# Patient Record
Sex: Male | Born: 1935 | Race: Black or African American | Hispanic: No | Marital: Single | Smoking: Former smoker
Health system: Southern US, Community
[De-identification: ages and names within clinical notes are randomized; demographics above are authoritative.]

## PROBLEM LIST (undated history)

## (undated) DIAGNOSIS — E119 Type 2 diabetes mellitus without complications: Secondary | ICD-10-CM

## (undated) DIAGNOSIS — M869 Osteomyelitis, unspecified: Secondary | ICD-10-CM

## (undated) DIAGNOSIS — N289 Disorder of kidney and ureter, unspecified: Secondary | ICD-10-CM

## (undated) DIAGNOSIS — G8220 Paraplegia, unspecified: Secondary | ICD-10-CM

## (undated) DIAGNOSIS — I4891 Unspecified atrial fibrillation: Secondary | ICD-10-CM

## (undated) DIAGNOSIS — I739 Peripheral vascular disease, unspecified: Secondary | ICD-10-CM

## (undated) DIAGNOSIS — N189 Chronic kidney disease, unspecified: Secondary | ICD-10-CM

## (undated) DIAGNOSIS — I2510 Atherosclerotic heart disease of native coronary artery without angina pectoris: Secondary | ICD-10-CM

## (undated) DIAGNOSIS — I639 Cerebral infarction, unspecified: Secondary | ICD-10-CM

## (undated) DIAGNOSIS — N319 Neuromuscular dysfunction of bladder, unspecified: Secondary | ICD-10-CM

## (undated) HISTORY — PX: ABOVE KNEE LEG AMPUTATION: SUR20

## (undated) HISTORY — PX: SUPRAPUBIC CATHETER PLACEMENT: SHX2473

## (undated) HISTORY — PX: CORONARY STENT PLACEMENT: SHX1402

## (undated) HISTORY — PX: PACEMAKER PLACEMENT: SHX43

---

## 2004-07-13 ENCOUNTER — Other Ambulatory Visit: Payer: Self-pay

## 2004-07-13 ENCOUNTER — Inpatient Hospital Stay: Payer: Self-pay | Admitting: Internal Medicine

## 2004-07-16 ENCOUNTER — Other Ambulatory Visit: Payer: Self-pay

## 2004-07-21 ENCOUNTER — Other Ambulatory Visit: Payer: Self-pay

## 2004-07-25 ENCOUNTER — Other Ambulatory Visit: Payer: Self-pay

## 2004-08-07 ENCOUNTER — Other Ambulatory Visit: Payer: Self-pay

## 2004-08-07 ENCOUNTER — Inpatient Hospital Stay: Payer: Self-pay | Admitting: Internal Medicine

## 2004-12-17 ENCOUNTER — Ambulatory Visit: Payer: Self-pay | Admitting: Specialist

## 2004-12-24 ENCOUNTER — Ambulatory Visit: Payer: Self-pay | Admitting: Specialist

## 2006-05-01 ENCOUNTER — Other Ambulatory Visit: Payer: Self-pay

## 2006-05-01 ENCOUNTER — Emergency Department: Payer: Self-pay | Admitting: Emergency Medicine

## 2006-05-16 ENCOUNTER — Inpatient Hospital Stay: Payer: Self-pay | Admitting: Internal Medicine

## 2006-05-16 ENCOUNTER — Other Ambulatory Visit: Payer: Self-pay

## 2006-05-17 ENCOUNTER — Other Ambulatory Visit: Payer: Self-pay

## 2006-05-22 ENCOUNTER — Other Ambulatory Visit: Payer: Self-pay

## 2007-09-22 ENCOUNTER — Other Ambulatory Visit: Payer: Self-pay

## 2007-09-22 ENCOUNTER — Inpatient Hospital Stay: Payer: Self-pay | Admitting: Internal Medicine

## 2007-09-23 ENCOUNTER — Other Ambulatory Visit: Payer: Self-pay

## 2007-09-24 ENCOUNTER — Other Ambulatory Visit: Payer: Self-pay

## 2007-09-25 ENCOUNTER — Other Ambulatory Visit: Payer: Self-pay

## 2007-11-12 IMAGING — CT CT ABD-PELV W/ CM
1 of 2 series · 15 of 32 positions shown, 19 images · IV contrast (APPLIED)
Comparison: none

REASON FOR EXAM: Abdominal pain, AAA
COMMENTS:

PROCEDURE:     CT  - CT ABDOMEN / PELVIS  W  - May 16, 2006  [DATE]
RESULT:     Axial images were obtained post intravenous injection of
contrast material.
The report was faxed to the Emergency Room.

[Series 4: aaa · axial · 0.74mm/px · z∈[-85,+329]mm · 15 of 156 slices shown, 19 images]
[im 12/156  soft-tissue]
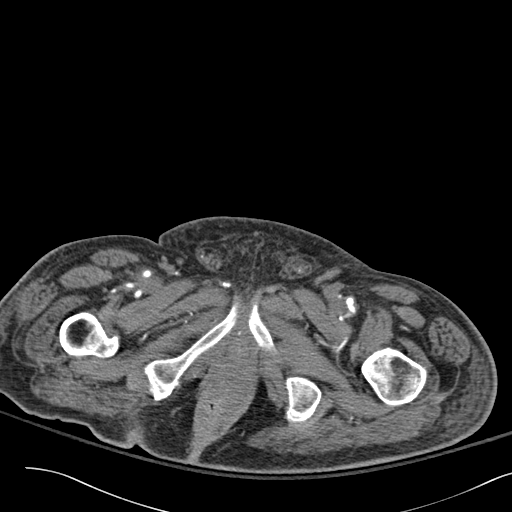
[im 12/156  bone]
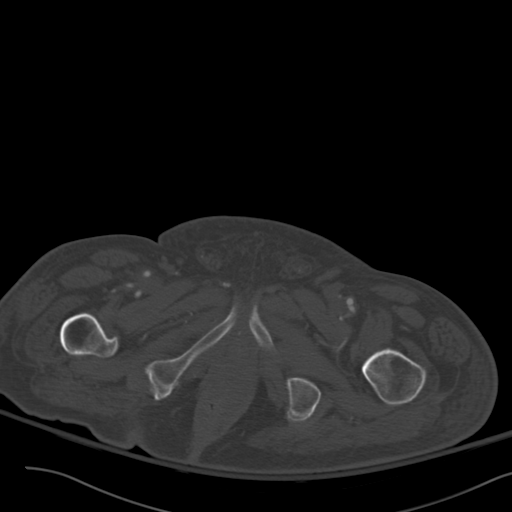
[im 23/156  soft-tissue]
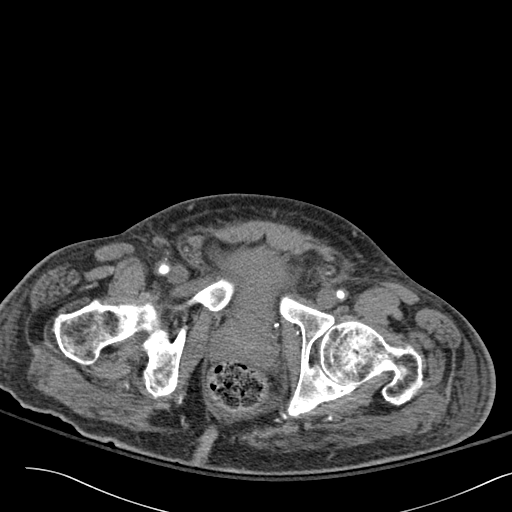
[im 35/156  soft-tissue]
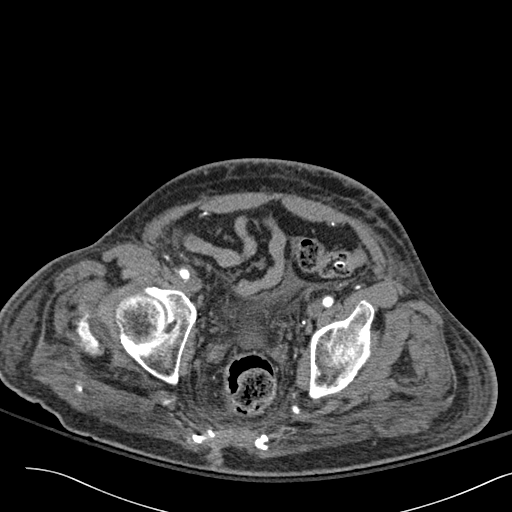
[im 46/156  soft-tissue]
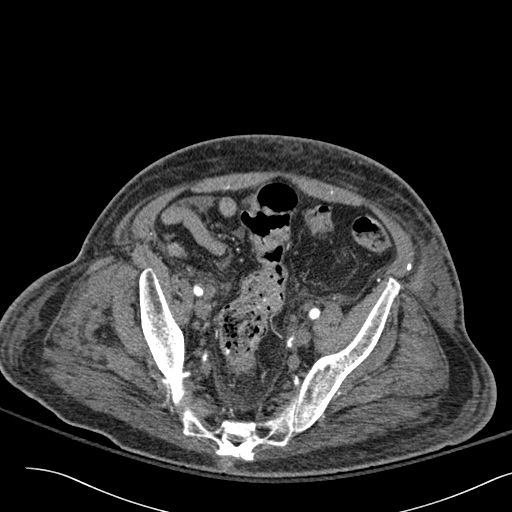
[im 58/156  soft-tissue]
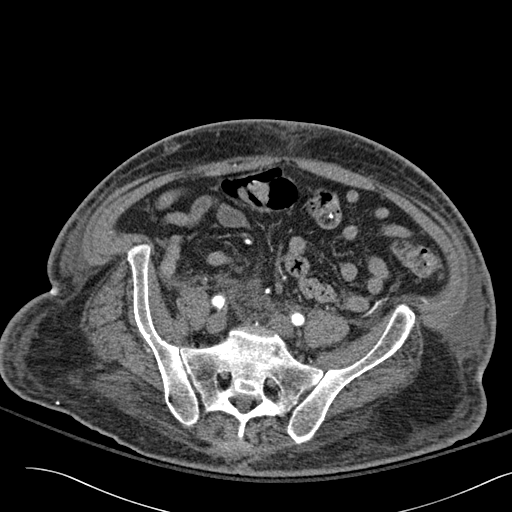
[im 69/156  soft-tissue]
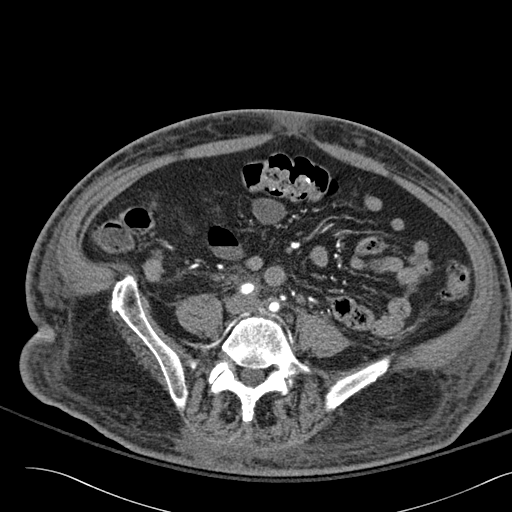
[im 81/156  soft-tissue]
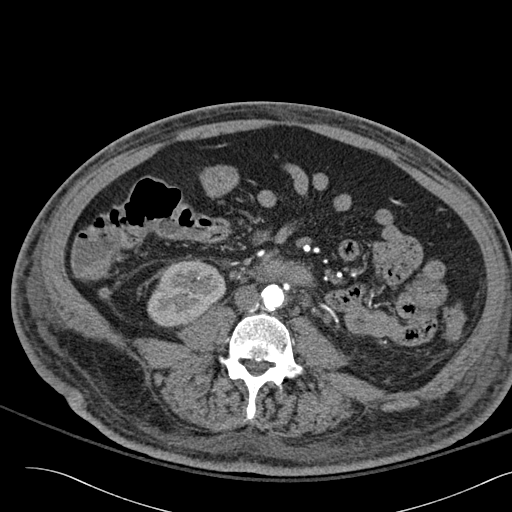
[im 92/156  soft-tissue]
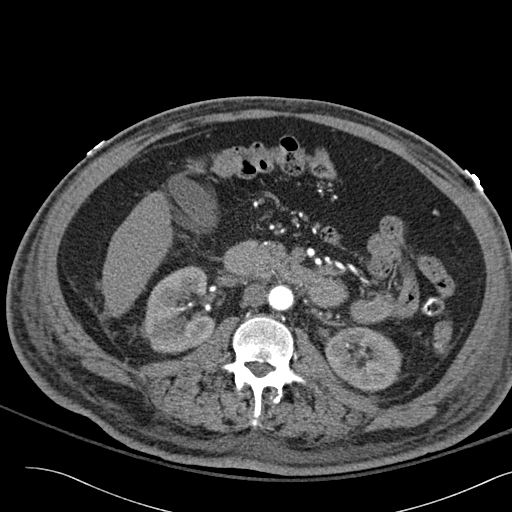
[im 104/156  soft-tissue]
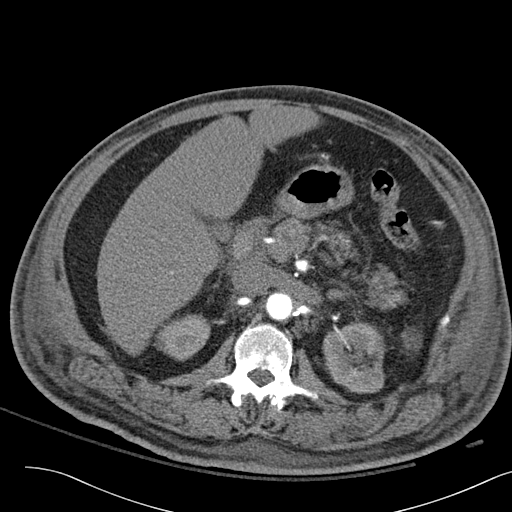
[im 104/156  bone]
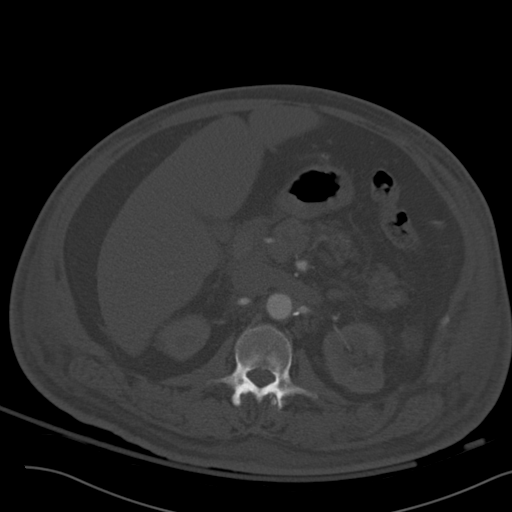
[im 115/156  soft-tissue]
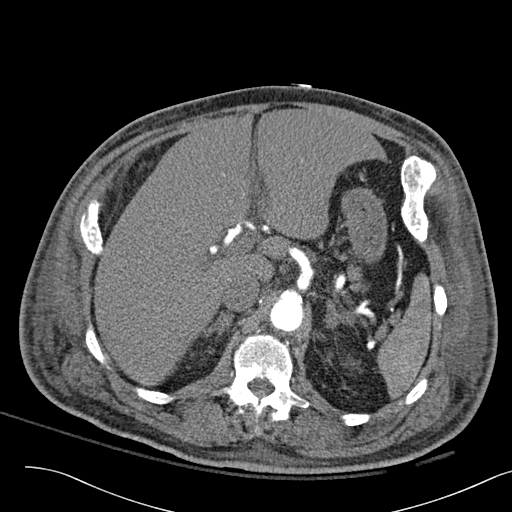
[im 127/156  soft-tissue]
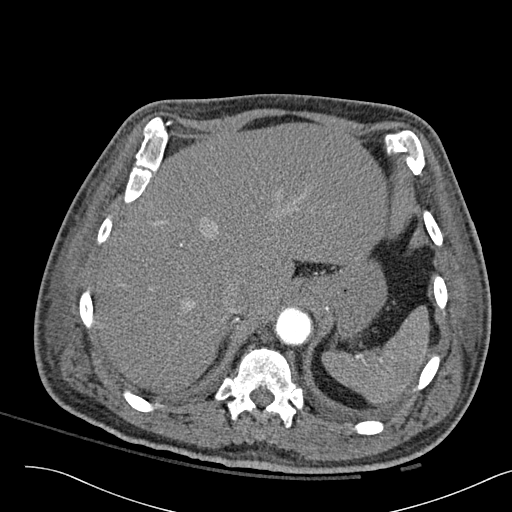
[im 133/156  lung]
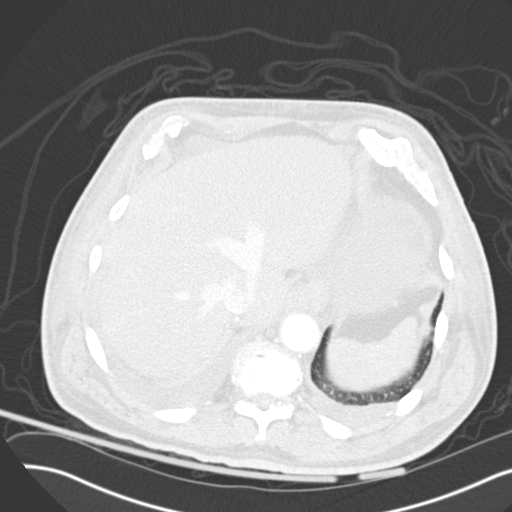
[im 138/156  soft-tissue]
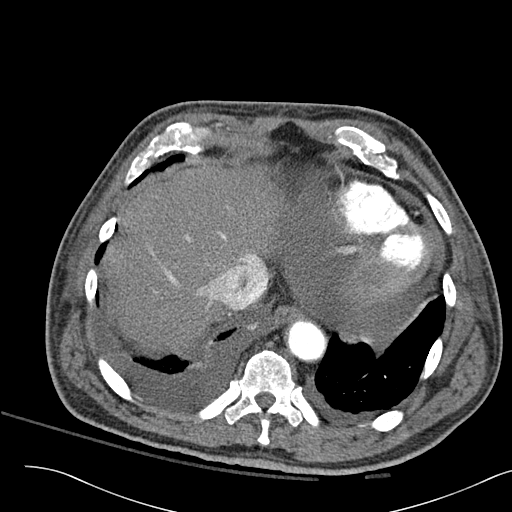
[im 138/156  lung]
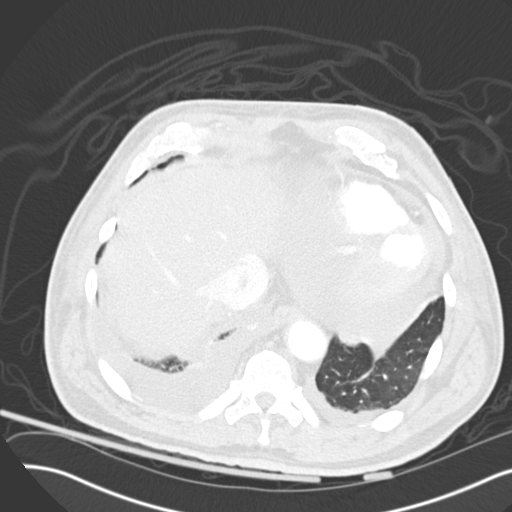
[im 144/156  lung]
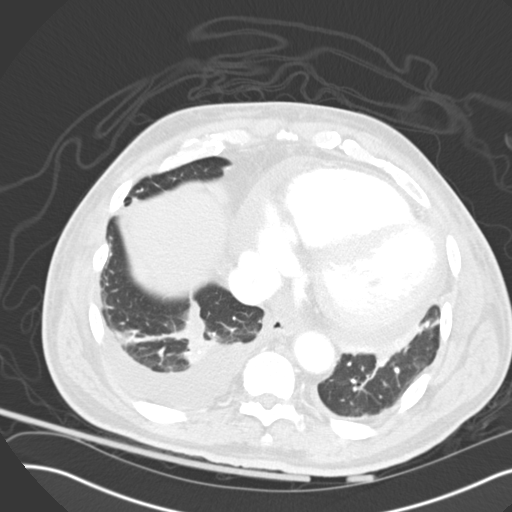
[im 150/156  soft-tissue]
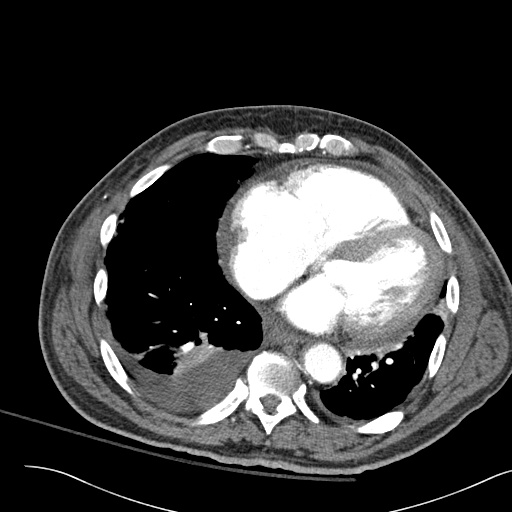
[im 150/156  lung]
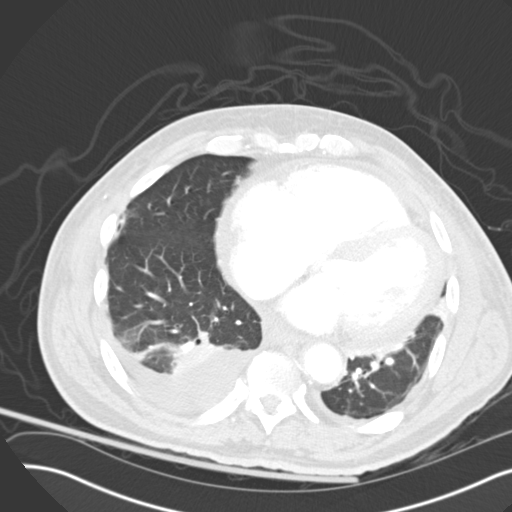

[15 of 32 positions shown; findings below may reference images not displayed]

FINDINGS: A small, RIGHT pleural effusion and small, LEFT pleural effusion
is seen. There is atelectasis in the lung bases. Cardiomegaly is noted with
some pericardial effusion present. No focal masses of the liver are
identified. The spleen appears intact.

No focal pancreatic masses are seen. Minimal fluid is present in the lateral
colonic gutter below the liver. There is some mild thickening of Gerota's
fascia on the RIGHT. There is noted some fluid in the pelvis. There is no
definite appendicitis or diverticulitis. There is noted some thickening of
the urinary bladder wall which could be cystitis and should be correlated
clinically. No hydronephrosis is seen of the kidneys. The adrenals appear
intact. No abdominal aortic aneurysm is noted.

There is noted some atelectasis in the RIGHT lung base.
IMPRESSION: 1.     RIGHT and LEFT effusions with atelectasis.
2.     Small amount of fluid is noted in the abdomen as well as in the
pelvis.
3.     There is thickening of the urinary bladder wall consistent with
cystitis.
4.     No abdominal aortic aneurysm is seen.
5.     Fluid is also seen about the gallbladder.
6.     There appears to be anasarca with some subcutaneous fluid present.
7.     There is also incidentally noted some pericardial effusion.

## 2007-11-14 ENCOUNTER — Observation Stay: Payer: Self-pay | Admitting: Internal Medicine

## 2007-11-14 ENCOUNTER — Other Ambulatory Visit: Payer: Self-pay

## 2007-12-13 ENCOUNTER — Ambulatory Visit: Payer: Self-pay | Admitting: Internal Medicine

## 2008-10-29 ENCOUNTER — Inpatient Hospital Stay: Payer: Self-pay | Admitting: Internal Medicine

## 2008-11-09 ENCOUNTER — Inpatient Hospital Stay: Payer: Self-pay | Admitting: Internal Medicine

## 2009-01-23 ENCOUNTER — Inpatient Hospital Stay: Payer: Self-pay | Admitting: Internal Medicine

## 2009-05-18 ENCOUNTER — Inpatient Hospital Stay: Payer: Self-pay | Admitting: Internal Medicine

## 2009-05-26 ENCOUNTER — Emergency Department: Payer: Self-pay | Admitting: Internal Medicine

## 2009-07-03 ENCOUNTER — Ambulatory Visit: Payer: Self-pay | Admitting: Ophthalmology

## 2009-08-01 ENCOUNTER — Ambulatory Visit: Payer: Self-pay | Admitting: Ophthalmology

## 2009-08-11 ENCOUNTER — Ambulatory Visit: Payer: Self-pay | Admitting: Ophthalmology

## 2009-12-17 ENCOUNTER — Emergency Department: Payer: Self-pay | Admitting: Emergency Medicine

## 2010-05-22 IMAGING — CR DG HAND COMPLETE 3+V*L*
1 series · 3 of 3 positions shown · non-contrast
Comparison: none

REASON FOR EXAM: pain and tenderness
COMMENTS:   Bedside (portable):Y

[Series 1: view not recorded · 0.17mm/px · 3 of 3 slices shown]
[im 1/3]
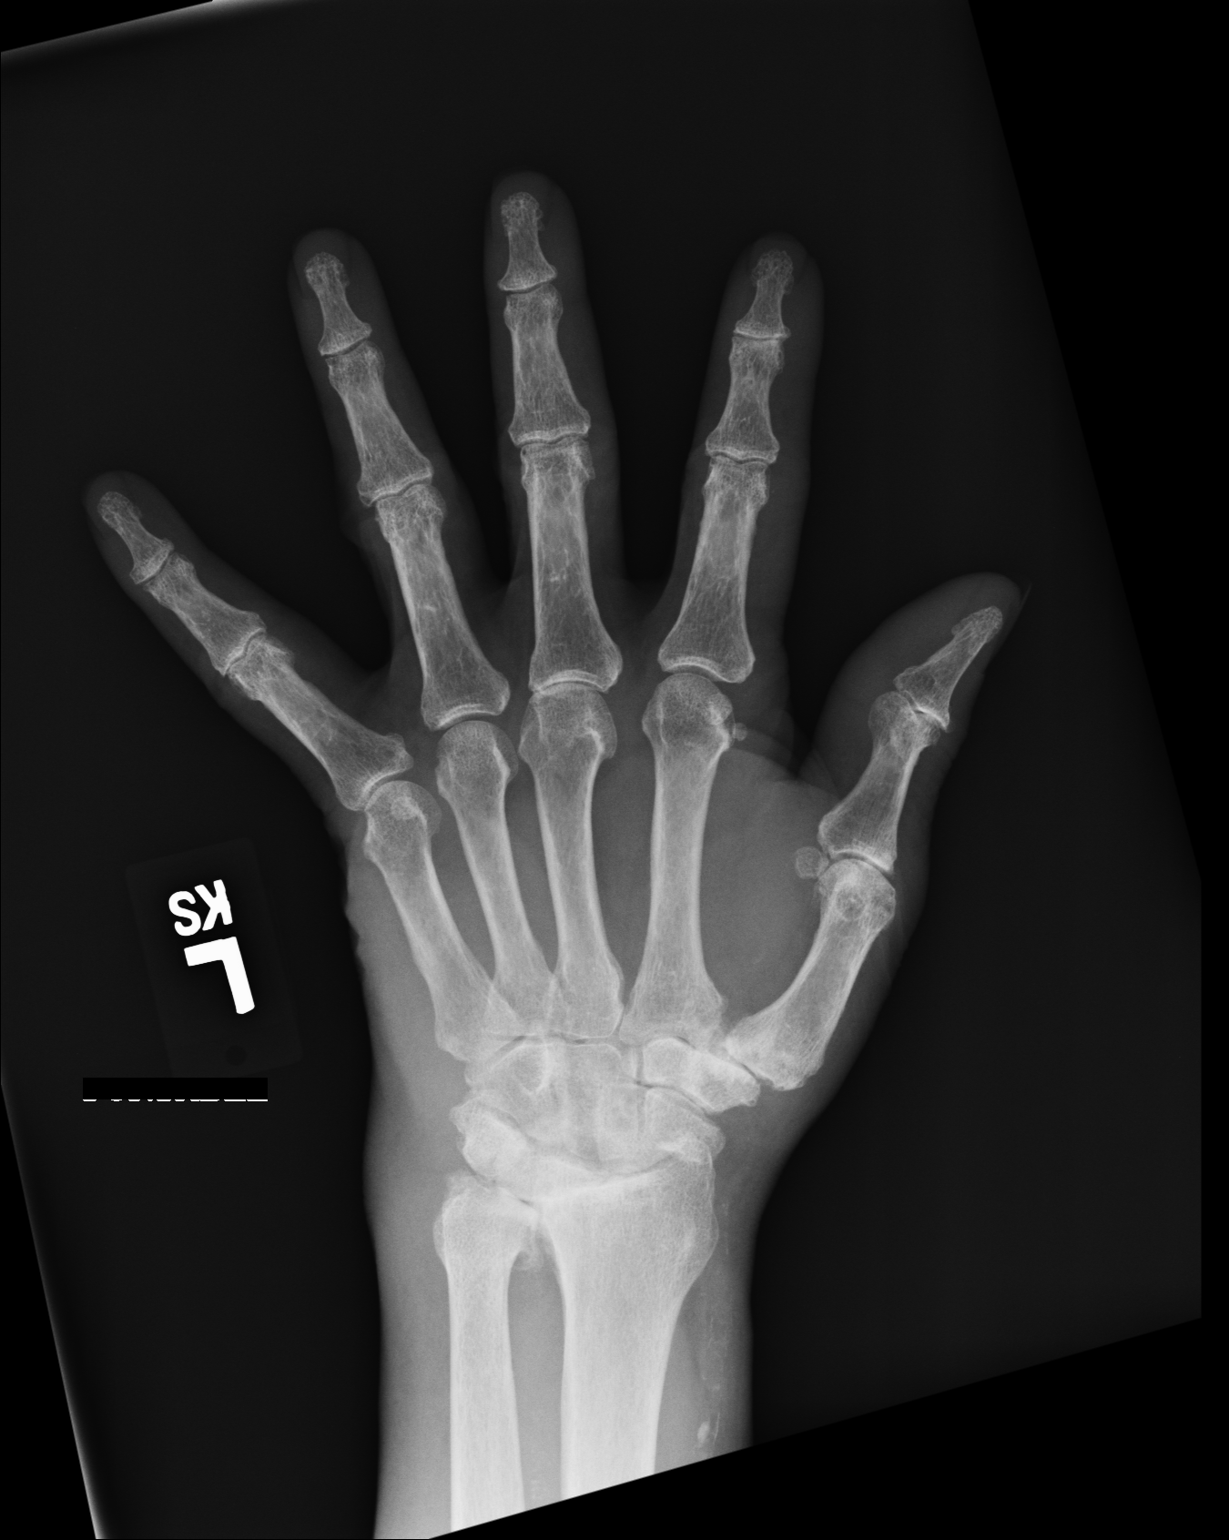
[im 2/3]
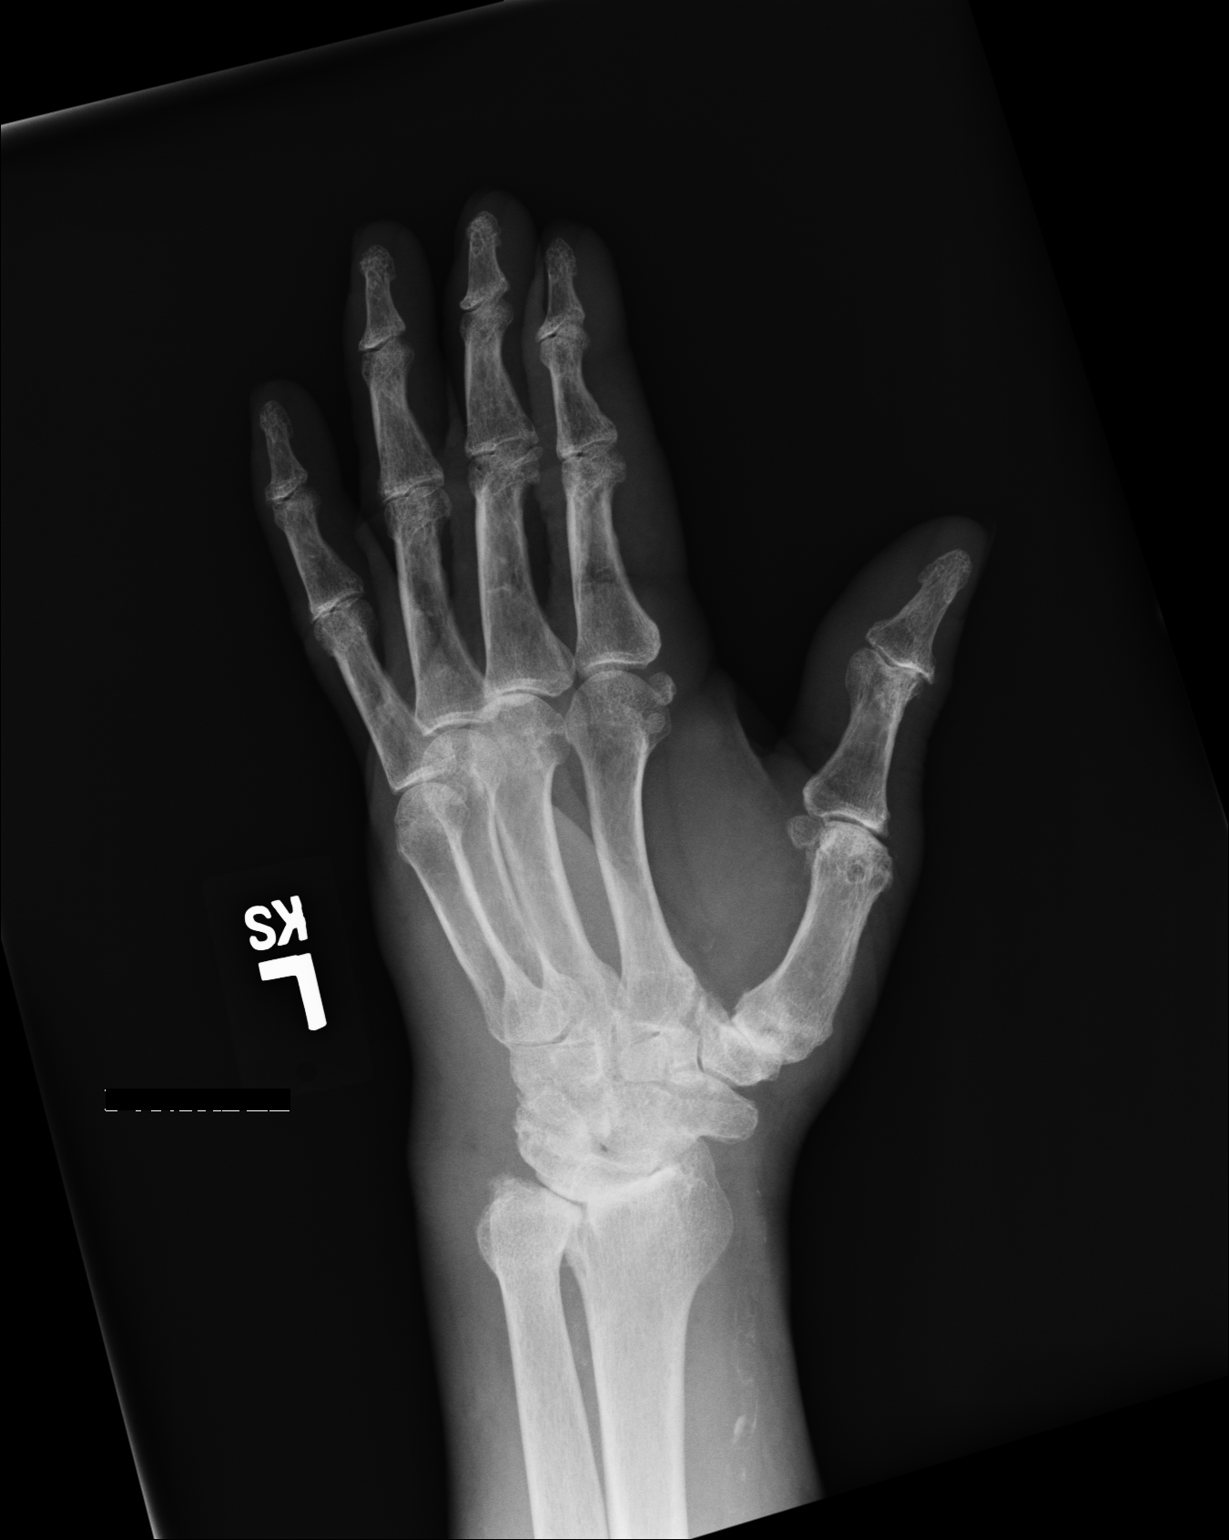
[im 3/3]
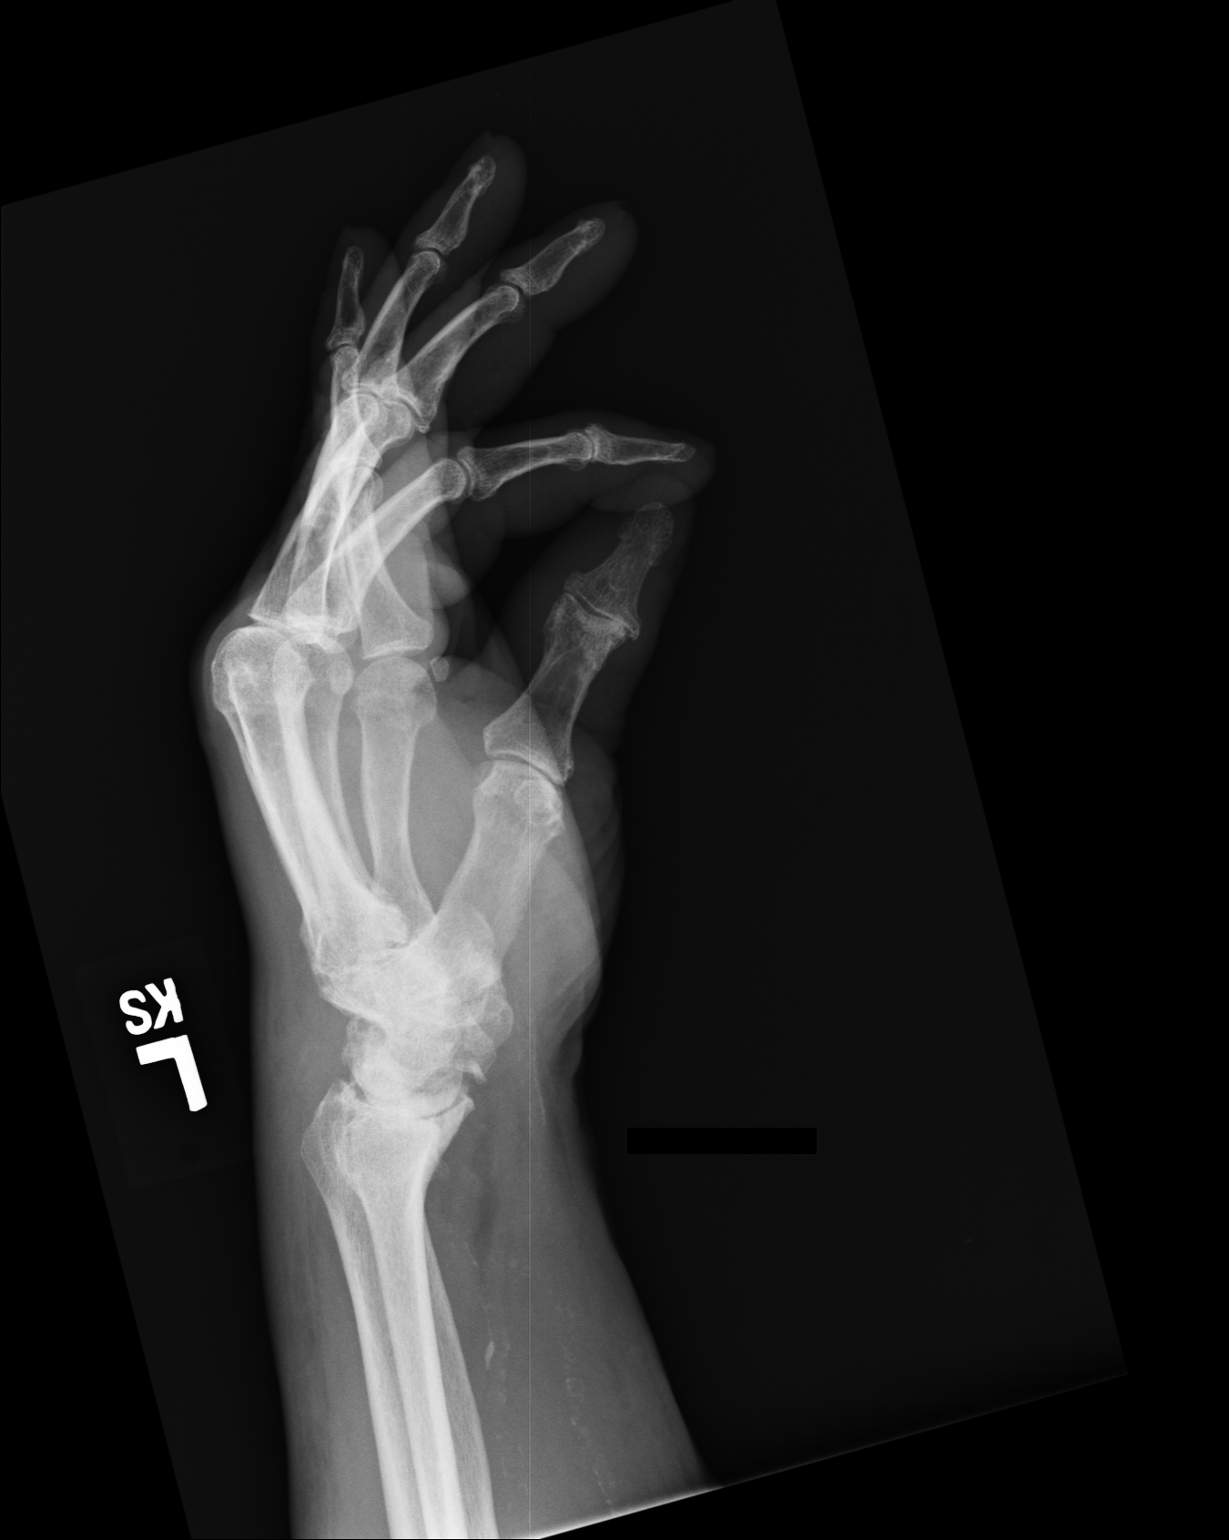

[3 of 3 positions shown; findings below may reference images not displayed]

PROCEDURE:     DXR - DXR HAND LT COMPLETE  W/OBLIQUES  - November 23, 2008  [DATE]

RESULT:     Three views of the left hand reveal the bones to be mildly
osteopenic. Degenerative changes of the wrist are present. There is
narrowing of the third carpometacarpal phalangeal joint and there are mild
degenerative changes involving the interphalangeal joints diffusely. I do
not see evidence of an acute fracture.
IMPRESSION: There are degenerative changes diffusely of the left hand
as well as mild osteopenia.

## 2010-05-22 IMAGING — CR LEFT WRIST - 2 VIEW
1 series · 2 of 2 positions shown · non-contrast
Comparison: none

REASON FOR EXAM: lt hand with pain and tenderness
COMMENTS:   Bedside (portable):Y

[Series 1: view not recorded · 0.17mm/px · 2 of 2 slices shown]
[im 1/2]
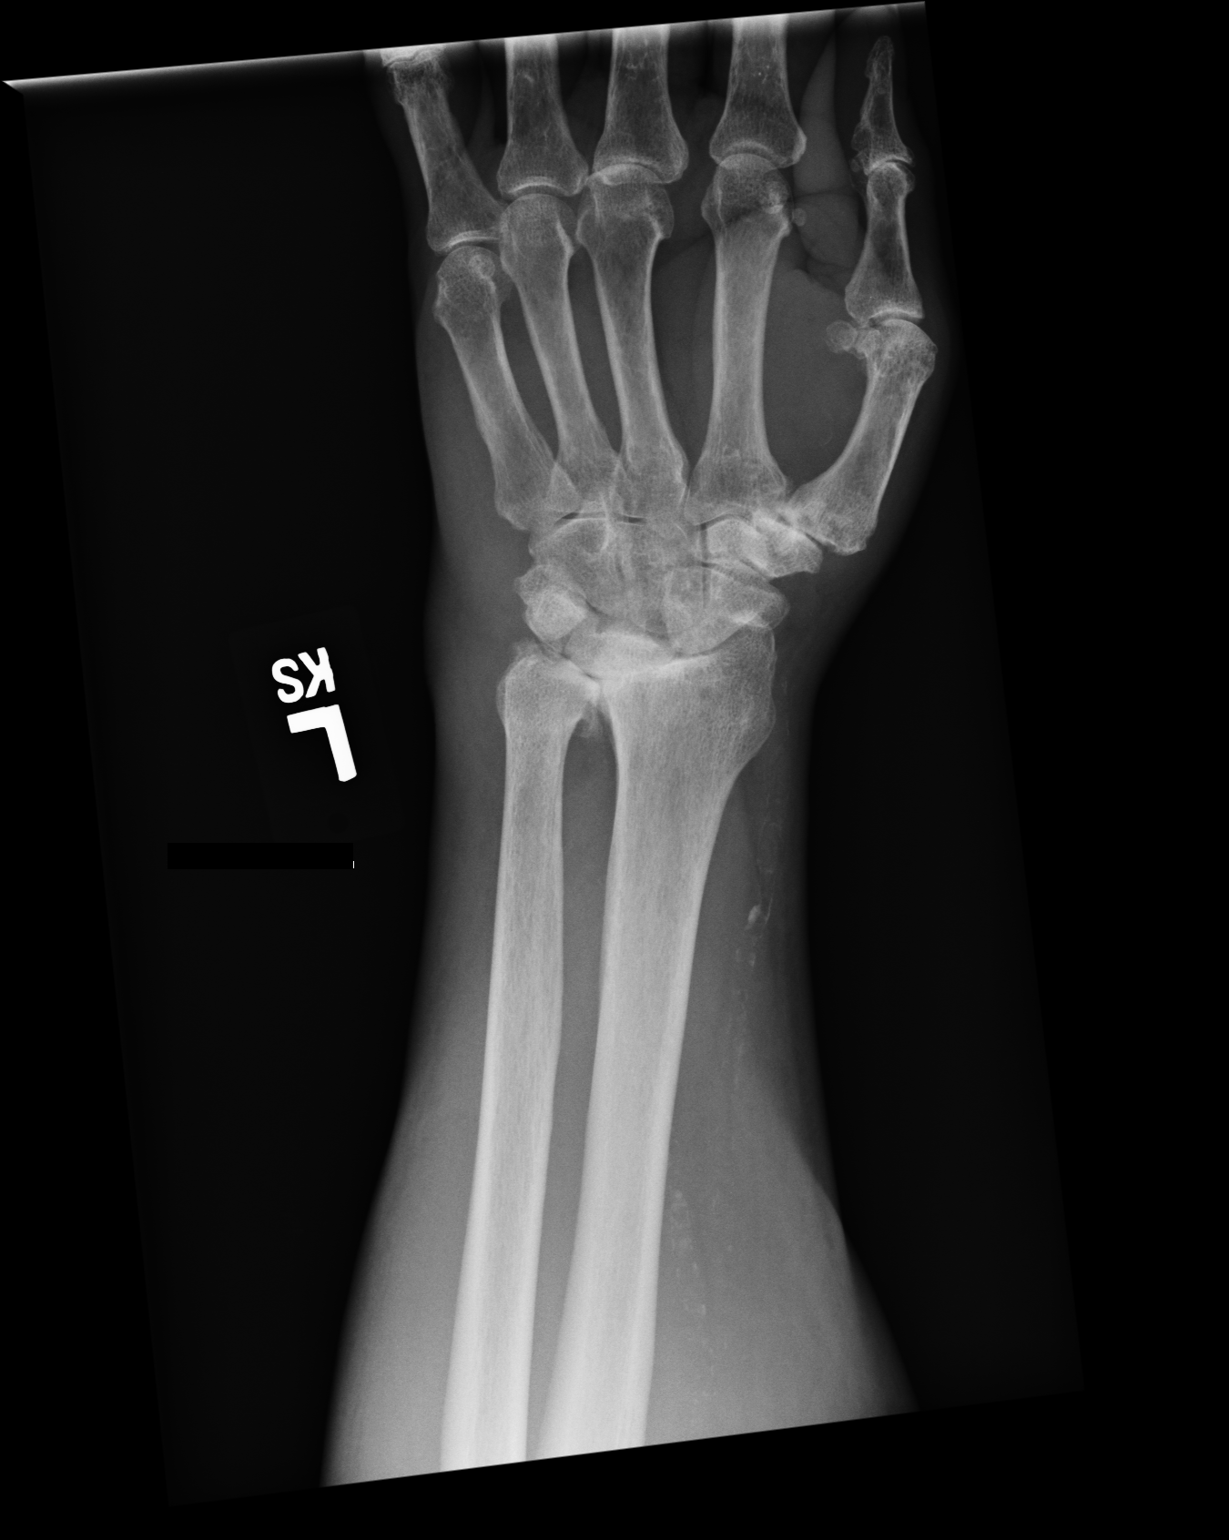
[im 2/2]
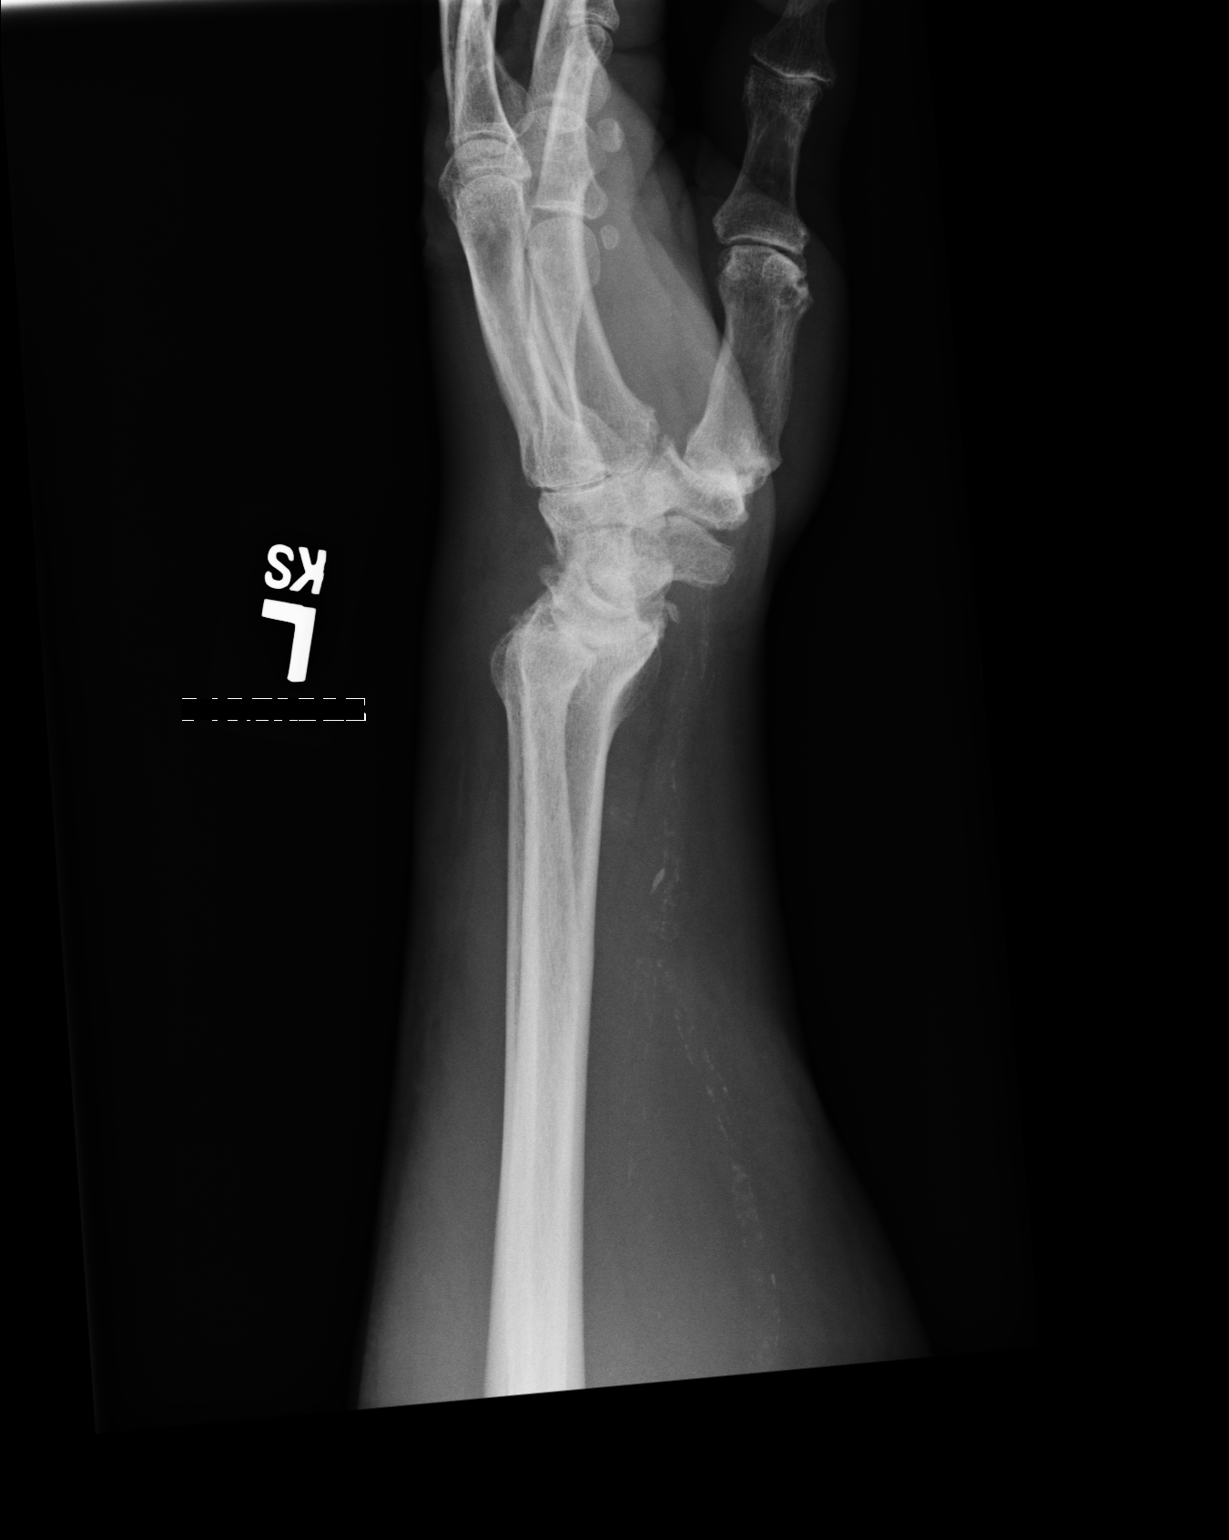

[2 of 2 positions shown; findings below may reference images not displayed]

PROCEDURE:     DXR - DXR WRIST LEFT AP AND LATERAL  - November 23, 2008  [DATE]

RESULT:     AP and lateral views of the left wrist are submitted. There is
degenerative joint space narrowing of the radiocarpal joints. There is some
condensation of the proximal carpal row consistent with chronic degenerative
change. There is a some loss of the ulnar styloid consistent with chronic
degenerative change. There is degenerative change of the first
carpometacarpal joint. Mild degenerative change of the third
metacarpophalangeal joint is present.
IMPRESSION: There are degenerative changes diffusely within the wrist
but especially aspect and the radiocarpal and ulnocarpal joints and the
proximal carpal row. Milder degenerative change of the first carpometacarpal
joint and of the third metacarpophalangeal joint is seen. There is vascular
calcification present. If there are clinical concerns of osteomyelitis, a
three-phase nuclear bone scan would be a useful next step.

## 2010-05-26 ENCOUNTER — Ambulatory Visit: Payer: Self-pay | Admitting: Gastroenterology

## 2010-05-29 LAB — PATHOLOGY REPORT

## 2010-06-16 ENCOUNTER — Ambulatory Visit: Payer: Self-pay | Admitting: Gastroenterology

## 2011-03-09 ENCOUNTER — Ambulatory Visit: Payer: Self-pay

## 2011-10-05 ENCOUNTER — Ambulatory Visit: Payer: Self-pay

## 2011-11-04 ENCOUNTER — Other Ambulatory Visit: Payer: Self-pay | Admitting: Family Medicine

## 2012-08-10 ENCOUNTER — Emergency Department: Payer: Self-pay | Admitting: Emergency Medicine

## 2013-03-12 ENCOUNTER — Inpatient Hospital Stay: Payer: Self-pay | Admitting: Internal Medicine

## 2013-03-12 LAB — PRO B NATRIURETIC PEPTIDE: B-Type Natriuretic Peptide: 3440 pg/mL — ABNORMAL HIGH (ref 0–450)

## 2013-03-12 LAB — COMPREHENSIVE METABOLIC PANEL
ALBUMIN: 2.8 g/dL — AB (ref 3.4–5.0)
AST: 19 U/L (ref 15–37)
Alkaline Phosphatase: 60 U/L
Anion Gap: 5 — ABNORMAL LOW (ref 7–16)
BUN: 17 mg/dL (ref 7–18)
Bilirubin,Total: 0.4 mg/dL (ref 0.2–1.0)
CHLORIDE: 104 mmol/L (ref 98–107)
CO2: 28 mmol/L (ref 21–32)
Calcium, Total: 8.7 mg/dL (ref 8.5–10.1)
Creatinine: 1.3 mg/dL (ref 0.60–1.30)
EGFR (African American): >60
EGFR (Non-African Amer.): 53 — ABNORMAL LOW
GLUCOSE: 97 mg/dL (ref 65–99)
Osmolality: 275 (ref 275–301)
POTASSIUM: 3.5 mmol/L (ref 3.5–5.1)
SGPT (ALT): 9 U/L — ABNORMAL LOW (ref 12–78)
Sodium: 137 mmol/L (ref 136–145)
Total Protein: 7.1 g/dL (ref 6.4–8.2)

## 2013-03-12 LAB — CK TOTAL AND CKMB (NOT AT ARMC)
CK, TOTAL: 265 U/L — AB (ref 35–232)
CK-MB: 3.8 ng/mL — ABNORMAL HIGH (ref 0.5–3.6)

## 2013-03-12 LAB — CBC
HCT: 22.1 % — AB (ref 40.0–52.0)
HGB: 7 g/dL — AB (ref 13.0–18.0)
MCH: 24.4 pg — ABNORMAL LOW (ref 26.0–34.0)
MCHC: 31.8 g/dL — AB (ref 32.0–36.0)
MCV: 77 fL — ABNORMAL LOW (ref 80–100)
Platelet: 317 10*3/uL (ref 150–440)
RBC: 2.89 10*6/uL — AB (ref 4.40–5.90)
RDW: 17.7 % — ABNORMAL HIGH (ref 11.5–14.5)
WBC: 5.7 10*3/uL (ref 3.8–10.6)

## 2013-03-12 LAB — TROPONIN I: TROPONIN-I: 0.19 ng/mL — AB

## 2013-03-12 LAB — PROTIME-INR
INR: 1.2
Prothrombin Time: 15.3 secs — ABNORMAL HIGH (ref 11.5–14.7)

## 2013-03-12 LAB — APTT: ACTIVATED PTT: 37.7 s — AB (ref 23.6–35.9)

## 2013-03-13 LAB — BASIC METABOLIC PANEL
ANION GAP: 5 — AB (ref 7–16)
BUN: 20 mg/dL — AB (ref 7–18)
Calcium, Total: 8.4 mg/dL — ABNORMAL LOW (ref 8.5–10.1)
Chloride: 106 mmol/L (ref 98–107)
Co2: 28 mmol/L (ref 21–32)
Creatinine: 1.3 mg/dL (ref 0.60–1.30)
EGFR (African American): >60
EGFR (Non-African Amer.): 53 — ABNORMAL LOW
Glucose: 106 mg/dL — ABNORMAL HIGH (ref 65–99)
Osmolality: 281 (ref 275–301)
POTASSIUM: 3.3 mmol/L — AB (ref 3.5–5.1)
Sodium: 139 mmol/L (ref 136–145)

## 2013-03-13 LAB — FERRITIN: Ferritin (ARMC): 11 ng/mL (ref 8–388)

## 2013-03-13 LAB — TROPONIN I
TROPONIN-I: 0.17 ng/mL — AB
Troponin-I: 0.17 ng/mL — ABNORMAL HIGH
Troponin-I: 0.18 ng/mL — ABNORMAL HIGH

## 2013-03-13 LAB — URIC ACID: URIC ACID: 10.6 mg/dL — AB (ref 3.5–7.2)

## 2013-03-13 LAB — CBC WITH DIFFERENTIAL/PLATELET
Basophil #: 0 10*3/uL (ref 0.0–0.1)
Basophil %: 0.8 %
Eosinophil #: 0.1 10*3/uL (ref 0.0–0.7)
Eosinophil %: 2.3 %
HCT: 19 % — ABNORMAL LOW (ref 40.0–52.0)
HGB: 6 g/dL — ABNORMAL LOW (ref 13.0–18.0)
LYMPHS PCT: 20.7 %
Lymphocyte #: 1 10*3/uL (ref 1.0–3.6)
MCH: 24.2 pg — AB (ref 26.0–34.0)
MCHC: 31.8 g/dL — ABNORMAL LOW (ref 32.0–36.0)
MCV: 76 fL — ABNORMAL LOW (ref 80–100)
MONOS PCT: 9.3 %
Monocyte #: 0.4 x10 3/mm (ref 0.2–1.0)
NEUTROS ABS: 3.2 10*3/uL (ref 1.4–6.5)
NEUTROS PCT: 66.9 %
Platelet: 275 10*3/uL (ref 150–440)
RBC: 2.49 10*6/uL — ABNORMAL LOW (ref 4.40–5.90)
RDW: 17 % — ABNORMAL HIGH (ref 11.5–14.5)
WBC: 4.8 10*3/uL (ref 3.8–10.6)

## 2013-03-13 LAB — URINALYSIS, COMPLETE
Bilirubin,UR: NEGATIVE
GLUCOSE, UR: NEGATIVE mg/dL (ref 0–75)
KETONE: NEGATIVE
Nitrite: NEGATIVE
PH: 5 (ref 4.5–8.0)
Protein: NEGATIVE
RBC,UR: 3 /HPF (ref 0–5)
Specific Gravity: 1.013 (ref 1.003–1.030)

## 2013-03-13 LAB — IRON AND TIBC
Iron Bind.Cap.(Total): 280 ug/dL (ref 250–450)
Iron Saturation: 5 %
Iron: 15 ug/dL — ABNORMAL LOW (ref 65–175)
UNBOUND IRON-BIND. CAP.: 265 ug/dL

## 2013-03-14 LAB — BASIC METABOLIC PANEL
Anion Gap: 8 (ref 7–16)
BUN: 30 mg/dL — ABNORMAL HIGH (ref 7–18)
CHLORIDE: 101 mmol/L (ref 98–107)
CO2: 24 mmol/L (ref 21–32)
CREATININE: 1.46 mg/dL — AB (ref 0.60–1.30)
Calcium, Total: 8.8 mg/dL (ref 8.5–10.1)
EGFR (African American): 54 — ABNORMAL LOW
EGFR (Non-African Amer.): 46 — ABNORMAL LOW
Glucose: 200 mg/dL — ABNORMAL HIGH (ref 65–99)
OSMOLALITY: 278 (ref 275–301)
POTASSIUM: 3.7 mmol/L (ref 3.5–5.1)
Sodium: 133 mmol/L — ABNORMAL LOW (ref 136–145)

## 2013-03-14 LAB — CBC WITH DIFFERENTIAL/PLATELET
BASOS ABS: 0 10*3/uL (ref 0.0–0.1)
Basophil %: 0.1 %
Eosinophil #: 0 10*3/uL (ref 0.0–0.7)
Eosinophil %: 0 %
HCT: 25.9 % — AB (ref 40.0–52.0)
HGB: 8.3 g/dL — ABNORMAL LOW (ref 13.0–18.0)
LYMPHS PCT: 15.1 %
Lymphocyte #: 0.7 10*3/uL — ABNORMAL LOW (ref 1.0–3.6)
MCH: 24.4 pg — AB (ref 26.0–34.0)
MCHC: 31.9 g/dL — ABNORMAL LOW (ref 32.0–36.0)
MCV: 77 fL — ABNORMAL LOW (ref 80–100)
Monocyte #: 0.1 x10 3/mm — ABNORMAL LOW (ref 0.2–1.0)
Monocyte %: 1.2 %
Neutrophil #: 4.1 10*3/uL (ref 1.4–6.5)
Neutrophil %: 83.6 %
Platelet: 363 10*3/uL (ref 150–440)
RBC: 3.38 10*6/uL — ABNORMAL LOW (ref 4.40–5.90)
RDW: 17.4 % — ABNORMAL HIGH (ref 11.5–14.5)
WBC: 4.9 10*3/uL (ref 3.8–10.6)

## 2013-03-15 LAB — URINE CULTURE

## 2013-06-19 ENCOUNTER — Ambulatory Visit: Payer: Self-pay | Admitting: Gastroenterology

## 2013-06-25 LAB — PATHOLOGY REPORT

## 2014-06-22 NOTE — Discharge Summary (Signed)
PATIENT NAME:  Roger Winters, Roger Winters MR#:  [id number] DATE OF BIRTH:  09-16-1935  DATE OF ADMISSION:  03/12/2013  DATE OF DISCHARGE:  03/14/2013  PRIMARY CARE PHYSICIAN:  Dr. Serafina Royals in the [facility]  FINAL DIAGNOSES: 1.  Acute gout, left wrist.  2.  Chronic iron deficiency anemia.  3.  Hypertension.  4.  Coronary artery disease.  5.  Hyperlipidemia.  6.  Chronic kidney disease stage III.   MEDICATIONS ON DISCHARGE: Include allopurinol 100 mg daily, simvastatin 20 mg at bedtime. Prednisone 5 mg - 4 tablets day one and two, 3 tablets day three, 2 tablets day four, 1 tablet day five and six, then stop. Lisinopril 20 mg twice a day, ferrous sulfate 325 mg twice a day, omeprazole 20 mg daily.   HOME HEALTH: Yes, through [employer].   DIET: Low sodium diet, regular consistency.   ACTIVITY: As tolerated.   FOLLOW UP:  With Dr. Gustavo Lah, Gastroenterology, in a few weeks.   REASON FOR ADMISSION: The patient was admitted 03/12/2013, discharged 03/14/2013. Came in with left hand swelling, suspected gout. The patient was placed on prednisone and Colchicine.   LABORATORY AND RADIOLOGICAL DATA DURING THE HOSPITAL COURSE: Included BNP 3440, glucose 97, BUN 17, creatinine 1.3, sodium 137, potassium 3.5, chloride 104, CO2 of 28, calcium 8.7. Liver function tests normal range, except for albumin low at 2.8. White blood cell count 5.7, H and H 7.0 and 22.1, platelet count of 317. PT, INR, PTT:  15.3, 1.2, 37.7. Troponin borderline at 0.19. Forearm x-ray:  Negative. Chest x-ray:  Chronic cardiomegaly, without failure. EKG: Paced. Ultrasound of the left upper extremity:  No DVT. Urinalysis positive for leukocyte esterase. Uric acid 10.6. Next troponin borderline at 0.17. Ferritin 11, TIBC 280, iron serum 15. Repeat hemoglobin 6. Next troponin 0.17. Urine culture had what looks like 3 organisms in it. White blood cell count upon discharge 4.9, hemoglobin 8.3, platelet count of 367. Creatinine  was 1.46, potassium 3.7.   HOSPITAL COURSE PER PROBLEM LIST:  1.  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 I changed the prednisone to Solu-Medrol, and I gave an extra dose of colchicine. When I saw him on the 13th , by the 14th his gout was much improved. He was able to move his wrist. It was still a little swollen. I did give him a prednisone taper to go home with. His uric acid is a little high, he can start on allopurinol as outpatient.   2.  For his chronic iron deficiency anemia, looking back through old records, he did have a colonoscopy and endoscopy back in 2012 by Dr. Gustavo Lah. I will refer him back to Dr. Gustavo Lah as outpatient. I did start iron supplementation and omeprazole. The patient does have a chronic iron deficiency anemia, requiring 1 unit of blood during the hospitalization.   3.  Hypertension. The patient states that he takes lisinopril at home. I did restart that, since his blood pressure did become elevated when it was held initially. Blood pressure upon discharge was 132/77, but was as high as 188/102.   4.  History of CAD. Not on any aspirin or beta blocker at this  time.   5.  Hyperlipidemia, on simvastatin.   6.  Chronic kidney disease stage III. Continue to monitor as outpatient.   7.  The patient does have an indwelling suprapubic catheter. Urine culture was positive. This is likely colonization because the patient showed no signs of sepsis. Normal white count. No fever. I do recommend changing the suprapubic catheter as per routine.    Time spent on discharge: 35 minutes.    ____________________________ Tana Conch. Leslye Peer, MD rjw:mr D: 03/15/2013 15:27:34 ET T: 03/15/2013 19:25:16  ET JOB#: [id number]  cc: Tana Conch. Leslye Peer, MD, <Dictator> Jomarie Longs, DO Lollie Sails, MD   Marisue Brooklyn MD ELECTRONICALLY SIGNED 03/18/2013 14:32
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
## 2014-12-26 ENCOUNTER — Inpatient Hospital Stay: Payer: Medicare (Managed Care)

## 2014-12-26 ENCOUNTER — Inpatient Hospital Stay
Admit: 2014-12-26 | Discharge: 2014-12-26 | Disposition: A | Discharge status: Home / Self Care | Payer: Medicare (Managed Care) | Attending: Internal Medicine | Admitting: Internal Medicine

## 2014-12-26 ENCOUNTER — Inpatient Hospital Stay
Admission: EM | Admit: 2014-12-26 | Discharge: 2014-12-30 | DRG: 377 | Disposition: A | Discharge status: Home / Self Care | Payer: Medicare (Managed Care) | Attending: Internal Medicine | Admitting: Internal Medicine

## 2014-12-26 ENCOUNTER — Encounter: Payer: Self-pay | Admitting: Emergency Medicine

## 2014-12-26 DIAGNOSIS — K922 Gastrointestinal hemorrhage, unspecified: Secondary | ICD-10-CM | POA: Diagnosis not present

## 2014-12-26 DIAGNOSIS — N170 Acute kidney failure with tubular necrosis: Secondary | ICD-10-CM | POA: Diagnosis present

## 2014-12-26 DIAGNOSIS — E875 Hyperkalemia: Secondary | ICD-10-CM | POA: Diagnosis present

## 2014-12-26 DIAGNOSIS — Z791 Long term (current) use of non-steroidal anti-inflammatories (NSAID): Secondary | ICD-10-CM | POA: Diagnosis not present

## 2014-12-26 DIAGNOSIS — I130 Hypertensive heart and chronic kidney disease with heart failure and stage 1 through stage 4 chronic kidney disease, or unspecified chronic kidney disease: Secondary | ICD-10-CM | POA: Diagnosis present

## 2014-12-26 DIAGNOSIS — K2970 Gastritis, unspecified, without bleeding: Secondary | ICD-10-CM | POA: Diagnosis not present

## 2014-12-26 DIAGNOSIS — N179 Acute kidney failure, unspecified: Secondary | ICD-10-CM

## 2014-12-26 DIAGNOSIS — I252 Old myocardial infarction: Secondary | ICD-10-CM | POA: Diagnosis not present

## 2014-12-26 DIAGNOSIS — Z7982 Long term (current) use of aspirin: Secondary | ICD-10-CM | POA: Diagnosis not present

## 2014-12-26 DIAGNOSIS — M62838 Other muscle spasm: Secondary | ICD-10-CM | POA: Diagnosis present

## 2014-12-26 DIAGNOSIS — Z66 Do not resuscitate: Secondary | ICD-10-CM | POA: Diagnosis present

## 2014-12-26 DIAGNOSIS — Z79899 Other long term (current) drug therapy: Secondary | ICD-10-CM

## 2014-12-26 DIAGNOSIS — N183 Chronic kidney disease, stage 3 (moderate): Secondary | ICD-10-CM | POA: Diagnosis present

## 2014-12-26 DIAGNOSIS — Z89611 Acquired absence of right leg above knee: Secondary | ICD-10-CM | POA: Diagnosis not present

## 2014-12-26 DIAGNOSIS — Z86010 Personal history of colonic polyps: Secondary | ICD-10-CM | POA: Diagnosis not present

## 2014-12-26 DIAGNOSIS — Z833 Family history of diabetes mellitus: Secondary | ICD-10-CM | POA: Diagnosis not present

## 2014-12-26 DIAGNOSIS — I8500 Esophageal varices without bleeding: Secondary | ICD-10-CM | POA: Diagnosis present

## 2014-12-26 DIAGNOSIS — Z89612 Acquired absence of left leg above knee: Secondary | ICD-10-CM | POA: Diagnosis not present

## 2014-12-26 DIAGNOSIS — Z950 Presence of cardiac pacemaker: Secondary | ICD-10-CM

## 2014-12-26 DIAGNOSIS — D631 Anemia in chronic kidney disease: Secondary | ICD-10-CM | POA: Diagnosis present

## 2014-12-26 DIAGNOSIS — Z955 Presence of coronary angioplasty implant and graft: Secondary | ICD-10-CM | POA: Diagnosis not present

## 2014-12-26 DIAGNOSIS — G8220 Paraplegia, unspecified: Secondary | ICD-10-CM | POA: Diagnosis present

## 2014-12-26 DIAGNOSIS — I5020 Unspecified systolic (congestive) heart failure: Secondary | ICD-10-CM | POA: Diagnosis present

## 2014-12-26 DIAGNOSIS — D649 Anemia, unspecified: Secondary | ICD-10-CM

## 2014-12-26 DIAGNOSIS — N319 Neuromuscular dysfunction of bladder, unspecified: Secondary | ICD-10-CM | POA: Diagnosis present

## 2014-12-26 DIAGNOSIS — E1122 Type 2 diabetes mellitus with diabetic chronic kidney disease: Secondary | ICD-10-CM | POA: Diagnosis present

## 2014-12-26 DIAGNOSIS — D62 Acute posthemorrhagic anemia: Secondary | ICD-10-CM | POA: Diagnosis present

## 2014-12-26 DIAGNOSIS — I959 Hypotension, unspecified: Secondary | ICD-10-CM | POA: Diagnosis present

## 2014-12-26 DIAGNOSIS — K625 Hemorrhage of anus and rectum: Secondary | ICD-10-CM

## 2014-12-26 DIAGNOSIS — I2510 Atherosclerotic heart disease of native coronary artery without angina pectoris: Secondary | ICD-10-CM | POA: Diagnosis present

## 2014-12-26 DIAGNOSIS — Z87891 Personal history of nicotine dependence: Secondary | ICD-10-CM

## 2014-12-26 DIAGNOSIS — K2971 Gastritis, unspecified, with bleeding: Principal | ICD-10-CM | POA: Diagnosis present

## 2014-12-26 DIAGNOSIS — E1151 Type 2 diabetes mellitus with diabetic peripheral angiopathy without gangrene: Secondary | ICD-10-CM | POA: Diagnosis present

## 2014-12-26 DIAGNOSIS — N390 Urinary tract infection, site not specified: Secondary | ICD-10-CM | POA: Diagnosis present

## 2014-12-26 DIAGNOSIS — Z8673 Personal history of transient ischemic attack (TIA), and cerebral infarction without residual deficits: Secondary | ICD-10-CM | POA: Diagnosis not present

## 2014-12-26 DIAGNOSIS — I5022 Chronic systolic (congestive) heart failure: Secondary | ICD-10-CM | POA: Diagnosis present

## 2014-12-26 HISTORY — DX: Cerebral infarction, unspecified: I63.9

## 2014-12-26 HISTORY — DX: Neuromuscular dysfunction of bladder, unspecified: N31.9

## 2014-12-26 HISTORY — DX: Peripheral vascular disease, unspecified: I73.9

## 2014-12-26 HISTORY — DX: Paraplegia, unspecified: G82.20

## 2014-12-26 HISTORY — DX: Type 2 diabetes mellitus without complications: E11.9

## 2014-12-26 HISTORY — DX: Unspecified atrial fibrillation: I48.91

## 2014-12-26 HISTORY — DX: Chronic kidney disease, unspecified: N18.9

## 2014-12-26 HISTORY — DX: Atherosclerotic heart disease of native coronary artery without angina pectoris: I25.10

## 2014-12-26 LAB — CBC WITH DIFFERENTIAL/PLATELET
BASOS ABS: 0.1 10*3/uL (ref 0–0.1)
Basophils Relative: 1 %
EOS ABS: 0.1 10*3/uL (ref 0–0.7)
Eosinophils Relative: 1 %
HCT: 27.1 % — ABNORMAL LOW (ref 40.0–52.0)
HEMOGLOBIN: 8.9 g/dL — AB (ref 13.0–18.0)
LYMPHS ABS: 1.5 10*3/uL (ref 1.0–3.6)
LYMPHS PCT: 19 %
MCH: 31.8 pg (ref 26.0–34.0)
MCHC: 32.8 g/dL (ref 32.0–36.0)
MCV: 97 fL (ref 80.0–100.0)
Monocytes Absolute: 0.3 10*3/uL (ref 0.2–1.0)
Monocytes Relative: 4 %
NEUTROS PCT: 75 %
Neutro Abs: 6 10*3/uL (ref 1.4–6.5)
PLATELETS: 202 10*3/uL (ref 150–440)
RBC: 2.79 MIL/uL — AB (ref 4.40–5.90)
RDW: 16.4 % — ABNORMAL HIGH (ref 11.5–14.5)
WBC: 8 10*3/uL (ref 3.8–10.6)

## 2014-12-26 LAB — COMPREHENSIVE METABOLIC PANEL
ALK PHOS: 59 U/L (ref 38–126)
ALT: 7 U/L — AB (ref 17–63)
AST: 18 U/L (ref 15–41)
Albumin: 3.3 g/dL — ABNORMAL LOW (ref 3.5–5.0)
Anion gap: 5 (ref 5–15)
BUN: 66 mg/dL — AB (ref 6–20)
CALCIUM: 8.7 mg/dL — AB (ref 8.9–10.3)
CHLORIDE: 112 mmol/L — AB (ref 101–111)
CO2: 20 mmol/L — AB (ref 22–32)
CREATININE: 1.84 mg/dL — AB (ref 0.61–1.24)
GFR calc non Af Amer: 34 mL/min — ABNORMAL LOW (ref >60)
GFR, EST AFRICAN AMERICAN: 39 mL/min — AB (ref >60)
Glucose, Bld: 150 mg/dL — ABNORMAL HIGH (ref 65–99)
Potassium: 6.5 mmol/L — ABNORMAL HIGH (ref 3.5–5.1)
SODIUM: 137 mmol/L (ref 135–145)
Total Bilirubin: 0.6 mg/dL (ref 0.3–1.2)
Total Protein: 6.2 g/dL — ABNORMAL LOW (ref 6.5–8.1)

## 2014-12-26 LAB — URINALYSIS COMPLETE WITH MICROSCOPIC (ARMC ONLY)
Bilirubin Urine: NEGATIVE
Glucose, UA: NEGATIVE mg/dL
HGB URINE DIPSTICK: NEGATIVE
KETONES UR: NEGATIVE mg/dL
Nitrite: POSITIVE — AB
PH: 5 (ref 5.0–8.0)
PROTEIN: NEGATIVE mg/dL
SPECIFIC GRAVITY, URINE: 1.017 (ref 1.005–1.030)
SQUAMOUS EPITHELIAL / LPF: NONE SEEN

## 2014-12-26 LAB — GLUCOSE, CAPILLARY: GLUCOSE-CAPILLARY: 124 mg/dL — AB (ref 65–99)

## 2014-12-26 LAB — HEMOGLOBIN AND HEMATOCRIT, BLOOD
HCT: 30 % — ABNORMAL LOW (ref 40.0–52.0)
Hemoglobin: 9.9 g/dL — ABNORMAL LOW (ref 13.0–18.0)

## 2014-12-26 LAB — ABO/RH: ABO/RH(D): O POS

## 2014-12-26 LAB — BASIC METABOLIC PANEL
ANION GAP: 8 (ref 5–15)
BUN: 62 mg/dL — AB (ref 6–20)
CHLORIDE: 110 mmol/L (ref 101–111)
CO2: 18 mmol/L — ABNORMAL LOW (ref 22–32)
Calcium: 7.8 mg/dL — ABNORMAL LOW (ref 8.9–10.3)
Creatinine, Ser: 1.58 mg/dL — ABNORMAL HIGH (ref 0.61–1.24)
GFR calc Af Amer: 47 mL/min — ABNORMAL LOW (ref >60)
GFR, EST NON AFRICAN AMERICAN: 41 mL/min — AB (ref >60)
GLUCOSE: 85 mg/dL (ref 65–99)
POTASSIUM: 5.7 mmol/L — AB (ref 3.5–5.1)
Sodium: 136 mmol/L (ref 135–145)

## 2014-12-26 LAB — PREPARE RBC (CROSSMATCH)

## 2014-12-26 LAB — LIPASE, BLOOD: Lipase: 39 U/L (ref 11–51)

## 2014-12-26 LAB — BRAIN NATRIURETIC PEPTIDE: B Natriuretic Peptide: 78 pg/mL (ref 0.0–100.0)

## 2014-12-26 LAB — TROPONIN I: TROPONIN I: 0.04 ng/mL — AB (ref <0.031)

## 2014-12-26 LAB — PROTIME-INR
INR: 1.17
PROTHROMBIN TIME: 15.1 s — AB (ref 11.4–15.0)

## 2014-12-26 MED ORDER — SODIUM CHLORIDE 0.9 % IV SOLN
80.0000 mg | Freq: Once | INTRAVENOUS | Status: AC
  Administered 2014-12-26: 80 mg via INTRAVENOUS
  Filled 2014-12-26: qty 80

## 2014-12-26 MED ORDER — HYDROCODONE-ACETAMINOPHEN 5-325 MG PO TABS: 1.0000 | ORAL_TABLET | ORAL | Status: DC | PRN

## 2014-12-26 MED ORDER — SODIUM CHLORIDE 0.9 % IV SOLN
8.0000 mg/h | INTRAVENOUS | Status: DC
  Administered 2014-12-26 – 2014-12-27 (×2): 8 mg/h via INTRAVENOUS
  Filled 2014-12-26 (×4): qty 80

## 2014-12-26 MED ORDER — SODIUM CHLORIDE 0.9 % IV SOLN
INTRAVENOUS | Status: DC
  Administered 2014-12-26 – 2014-12-29 (×3): via INTRAVENOUS

## 2014-12-26 MED ORDER — ONDANSETRON HCL 4 MG PO TABS
4.0000 mg | ORAL_TABLET | Freq: Four times a day (QID) | ORAL | Status: DC | PRN
  Administered 2014-12-29: 4 mg via ORAL
  Filled 2014-12-26: qty 1

## 2014-12-26 MED ORDER — METFORMIN HCL 500 MG PO TABS: 500.0000 mg | ORAL_TABLET | Freq: Every day | ORAL | Status: DC

## 2014-12-26 MED ORDER — INSULIN ASPART 100 UNIT/ML ~~LOC~~ SOLN
10.0000 [IU] | Freq: Once | SUBCUTANEOUS | Status: AC
  Administered 2014-12-26: 10 [IU] via INTRAVENOUS
  Filled 2014-12-26: qty 10

## 2014-12-26 MED ORDER — DEXTROSE 50 % IV SOLN
25.0000 g | Freq: Once | INTRAVENOUS | Status: AC
  Administered 2014-12-26: 25 g via INTRAVENOUS
  Filled 2014-12-26: qty 50

## 2014-12-26 MED ORDER — BISACODYL 5 MG PO TBEC: 5.0000 mg | DELAYED_RELEASE_TABLET | Freq: Every day | ORAL | Status: DC | PRN

## 2014-12-26 MED ORDER — LOPERAMIDE HCL 2 MG PO CAPS: 2.0000 mg | ORAL_CAPSULE | ORAL | Status: DC | PRN

## 2014-12-26 MED ORDER — DOCUSATE SODIUM 100 MG PO CAPS
100.0000 mg | ORAL_CAPSULE | Freq: Two times a day (BID) | ORAL | Status: DC
  Administered 2014-12-27 – 2014-12-29 (×4): 100 mg via ORAL
  Filled 2014-12-26 (×7): qty 1

## 2014-12-26 MED ORDER — DEXTROSE 5 % IV SOLN
1.0000 g | Freq: Once | INTRAVENOUS | Status: AC
  Administered 2014-12-26: 1 g via INTRAVENOUS
  Filled 2014-12-26: qty 10

## 2014-12-26 MED ORDER — SODIUM CHLORIDE 0.9 % IV SOLN
50.0000 ug/h | INTRAVENOUS | Status: AC
  Administered 2014-12-26 – 2014-12-27 (×2): 50 ug/h via INTRAVENOUS
  Filled 2014-12-26 (×6): qty 1

## 2014-12-26 MED ORDER — ALLOPURINOL 100 MG PO TABS
200.0000 mg | ORAL_TABLET | Freq: Every day | ORAL | Status: DC
  Administered 2014-12-26 – 2014-12-30 (×5): 200 mg via ORAL
  Filled 2014-12-26 (×5): qty 2

## 2014-12-26 MED ORDER — ACETAMINOPHEN 650 MG RE SUPP: 650.0000 mg | Freq: Four times a day (QID) | RECTAL | Status: DC | PRN

## 2014-12-26 MED ORDER — PNEUMOCOCCAL VAC POLYVALENT 25 MCG/0.5ML IJ INJ
0.5000 mL | INJECTION | INTRAMUSCULAR | Status: AC
  Administered 2014-12-27: 0.5 mL via INTRAMUSCULAR
  Filled 2014-12-26: qty 0.5

## 2014-12-26 MED ORDER — VITAMIN C 500 MG PO TABS
500.0000 mg | ORAL_TABLET | Freq: Two times a day (BID) | ORAL | Status: DC
  Administered 2014-12-26 – 2014-12-30 (×8): 500 mg via ORAL
  Filled 2014-12-26 (×8): qty 1

## 2014-12-26 MED ORDER — PANTOPRAZOLE SODIUM 40 MG IV SOLR: 40.0000 mg | Freq: Two times a day (BID) | INTRAVENOUS | Status: DC

## 2014-12-26 MED ORDER — DEXTROSE 5 % IV SOLN
1.0000 g | INTRAVENOUS | Status: DC
  Administered 2014-12-27 – 2014-12-28 (×2): 1 g via INTRAVENOUS
  Filled 2014-12-26 (×3): qty 10

## 2014-12-26 MED ORDER — BACLOFEN 10 MG PO TABS
5.0000 mg | ORAL_TABLET | Freq: Every day | ORAL | Status: DC | PRN
  Administered 2014-12-29: 5 mg via ORAL
  Filled 2014-12-26: qty 2

## 2014-12-26 MED ORDER — ONDANSETRON HCL 4 MG/2ML IJ SOLN: 4.0000 mg | Freq: Four times a day (QID) | INTRAMUSCULAR | Status: DC | PRN

## 2014-12-26 MED ORDER — SIMVASTATIN 20 MG PO TABS
20.0000 mg | ORAL_TABLET | Freq: Every day | ORAL | Status: DC
  Administered 2014-12-26 – 2014-12-29 (×4): 20 mg via ORAL
  Filled 2014-12-26 (×3): qty 1

## 2014-12-26 MED ORDER — SODIUM CHLORIDE 0.9 % IJ SOLN
3.0000 mL | Freq: Two times a day (BID) | INTRAMUSCULAR | Status: DC
  Administered 2014-12-27 – 2014-12-30 (×5): 3 mL via INTRAVENOUS

## 2014-12-26 MED ORDER — FLUTICASONE PROPIONATE 50 MCG/ACT NA SUSP
2.0000 | Freq: Every day | NASAL | Status: DC
  Administered 2014-12-26 – 2014-12-28 (×3): 2 via NASAL
  Filled 2014-12-26: qty 16

## 2014-12-26 MED ORDER — TRAZODONE HCL 50 MG PO TABS: 25.0000 mg | ORAL_TABLET | Freq: Every evening | ORAL | Status: DC | PRN

## 2014-12-26 MED ORDER — PANTOPRAZOLE SODIUM 40 MG IV SOLR
40.0000 mg | Freq: Once | INTRAVENOUS | Status: AC
  Administered 2014-12-26: 40 mg via INTRAVENOUS
  Filled 2014-12-26: qty 40

## 2014-12-26 MED ORDER — SODIUM CHLORIDE 0.9 % IV SOLN
10.0000 mL/h | Freq: Once | INTRAVENOUS | Status: AC
  Administered 2014-12-26: 10 mL/h via INTRAVENOUS

## 2014-12-26 MED ORDER — ACETAMINOPHEN 325 MG PO TABS: 650.0000 mg | ORAL_TABLET | Freq: Four times a day (QID) | ORAL | Status: DC | PRN

## 2014-12-26 MED ORDER — VITAMIN D (ERGOCALCIFEROL) 1.25 MG (50000 UNIT) PO CAPS
50000.0000 [IU] | ORAL_CAPSULE | ORAL | Status: DC
  Administered 2014-12-26: 50000 [IU] via ORAL
  Filled 2014-12-26: qty 1

## 2014-12-26 MED ORDER — ALUM & MAG HYDROXIDE-SIMETH 200-200-20 MG/5ML PO SUSP: 30.0000 mL | Freq: Four times a day (QID) | ORAL | Status: DC | PRN

## 2014-12-26 NOTE — ED Notes (Signed)
If y'all have any questions regarding his care at [facility], call Stacy at [phone] (direct number to care provider).
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
## 2014-12-26 NOTE — Progress Notes (Signed)
Nuclear medicine called and stated that as patient states he had last BM 2 hours ago, he does not qualify for the study. If he has another active bleed I have been directed to call the tech on call. MD is aware.

## 2014-12-26 NOTE — Progress Notes (Signed)
Patient transferred to unit from ED. He is alert and oriented and states he came to hospital due to multiple black, tarry stools. He denies abdominal pain and vs remain stable: BP 139/86 mmHg  Pulse 65  Temp(Src) 97.6 F (36.4 C) (Oral)  Resp 14  Ht 4' (1.219 m)  Wt 96.2 kg (212 lb 1.3 oz)  BMI 64.74 kg/m2  SpO2 99%. MD at bedside, patient has no complaints at this time.
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
## 2014-12-26 NOTE — Progress Notes (Signed)
*  PRELIMINARY RESULTS* Echocardiogram 2D Echocardiogram has been performed.  Roger Winters 12/26/2014, 6:02 PM

## 2014-12-26 NOTE — Consult Note (Signed)
See Roger Winters for full consult  Pt with melena and NSAID use hgb 8, probable UGI bleed.  Will do EGD tomorrow.
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
## 2014-12-27 ENCOUNTER — Inpatient Hospital Stay: Payer: Medicare (Managed Care) | Admitting: Anesthesiology

## 2014-12-27 ENCOUNTER — Encounter
Admission: EM | Disposition: A | Discharge status: Home / Self Care | Payer: Self-pay | Source: Home / Self Care | Attending: Internal Medicine

## 2014-12-27 DIAGNOSIS — E875 Hyperkalemia: Secondary | ICD-10-CM

## 2014-12-27 DIAGNOSIS — K2971 Gastritis, unspecified, with bleeding: Secondary | ICD-10-CM | POA: Diagnosis not present

## 2014-12-27 DIAGNOSIS — K2970 Gastritis, unspecified, without bleeding: Secondary | ICD-10-CM

## 2014-12-27 HISTORY — PX: ESOPHAGOGASTRODUODENOSCOPY: SHX5428

## 2014-12-27 LAB — COMPREHENSIVE METABOLIC PANEL
ALT: 7 U/L — AB (ref 17–63)
AST: 10 U/L — AB (ref 15–41)
Albumin: 2.5 g/dL — ABNORMAL LOW (ref 3.5–5.0)
Alkaline Phosphatase: 44 U/L (ref 38–126)
Anion gap: 3 — ABNORMAL LOW (ref 5–15)
BILIRUBIN TOTAL: 0.6 mg/dL (ref 0.3–1.2)
BUN: 55 mg/dL — AB (ref 6–20)
CO2: 18 mmol/L — ABNORMAL LOW (ref 22–32)
CREATININE: 1.52 mg/dL — AB (ref 0.61–1.24)
Calcium: 7.3 mg/dL — ABNORMAL LOW (ref 8.9–10.3)
Chloride: 117 mmol/L — ABNORMAL HIGH (ref 101–111)
GFR, EST AFRICAN AMERICAN: 49 mL/min — AB (ref >60)
GFR, EST NON AFRICAN AMERICAN: 42 mL/min — AB (ref >60)
Glucose, Bld: 136 mg/dL — ABNORMAL HIGH (ref 65–99)
POTASSIUM: 5.2 mmol/L — AB (ref 3.5–5.1)
Sodium: 138 mmol/L (ref 135–145)
TOTAL PROTEIN: 4.6 g/dL — AB (ref 6.5–8.1)

## 2014-12-27 LAB — CBC
HCT: 28.9 % — ABNORMAL LOW (ref 40.0–52.0)
HEMATOCRIT: 22.2 % — AB (ref 40.0–52.0)
HEMATOCRIT: 28.9 % — AB (ref 40.0–52.0)
HEMOGLOBIN: 9.7 g/dL — AB (ref 13.0–18.0)
Hemoglobin: 7.3 g/dL — ABNORMAL LOW (ref 13.0–18.0)
Hemoglobin: 9.5 g/dL — ABNORMAL LOW (ref 13.0–18.0)
MCH: 30.6 pg (ref 26.0–34.0)
MCH: 31 pg (ref 26.0–34.0)
MCH: 30.3 pg (ref 26.0–34.0)
MCHC: 32.7 g/dL (ref 32.0–36.0)
MCHC: 33.4 g/dL (ref 32.0–36.0)
MCHC: 32.8 g/dL (ref 32.0–36.0)
MCV: 93.6 fL (ref 80.0–100.0)
MCV: 92.8 fL (ref 80.0–100.0)
MCV: 92.4 fL (ref 80.0–100.0)
PLATELETS: 137 10*3/uL — AB (ref 150–440)
PLATELETS: 130 10*3/uL — AB (ref 150–440)
Platelets: 127 10*3/uL — ABNORMAL LOW (ref 150–440)
RBC: 2.38 MIL/uL — ABNORMAL LOW (ref 4.40–5.90)
RBC: 3.12 MIL/uL — ABNORMAL LOW (ref 4.40–5.90)
RBC: 3.13 MIL/uL — AB (ref 4.40–5.90)
RDW: 18.1 % — AB (ref 11.5–14.5)
RDW: 16.9 % — ABNORMAL HIGH (ref 11.5–14.5)
RDW: 17.3 % — ABNORMAL HIGH (ref 11.5–14.5)
WBC: 5.3 10*3/uL (ref 3.8–10.6)
WBC: 7.2 10*3/uL (ref 3.8–10.6)
WBC: 7.2 10*3/uL (ref 3.8–10.6)

## 2014-12-27 LAB — PROTIME-INR
INR: 1.38
Prothrombin Time: 17.2 seconds — ABNORMAL HIGH (ref 11.4–15.0)

## 2014-12-27 LAB — GLUCOSE, CAPILLARY
GLUCOSE-CAPILLARY: 141 mg/dL — AB (ref 65–99)
Glucose-Capillary: 159 mg/dL — ABNORMAL HIGH (ref 65–99)
Glucose-Capillary: 123 mg/dL — ABNORMAL HIGH (ref 65–99)

## 2014-12-27 LAB — APTT: aPTT: 30 seconds (ref 24–36)

## 2014-12-27 LAB — PREPARE RBC (CROSSMATCH)

## 2014-12-27 SURGERY — ESOPHAGOGASTRODUODENOSCOPY (EGD) WITH PROPOFOL
Anesthesia: General | Laterality: Left

## 2014-12-27 SURGERY — EGD (ESOPHAGOGASTRODUODENOSCOPY)
Anesthesia: General

## 2014-12-27 MED ORDER — FENTANYL CITRATE (PF) 100 MCG/2ML IJ SOLN
INTRAMUSCULAR | Status: DC | PRN
  Administered 2014-12-27: 50 ug via INTRAVENOUS

## 2014-12-27 MED ORDER — LIDOCAINE HCL (CARDIAC) 20 MG/ML IV SOLN
INTRAVENOUS | Status: DC | PRN
  Administered 2014-12-27: 30 mg via INTRAVENOUS

## 2014-12-27 MED ORDER — EPHEDRINE SULFATE 50 MG/ML IJ SOLN
INTRAMUSCULAR | Status: DC | PRN
  Administered 2014-12-27: 10 mg via INTRAVENOUS

## 2014-12-27 MED ORDER — PANTOPRAZOLE SODIUM 40 MG PO TBEC
40.0000 mg | DELAYED_RELEASE_TABLET | Freq: Two times a day (BID) | ORAL | Status: DC
  Administered 2014-12-27 – 2014-12-30 (×6): 40 mg via ORAL
  Filled 2014-12-27 (×6): qty 1

## 2014-12-27 MED ORDER — SODIUM CHLORIDE 0.9 % IV SOLN
50.0000 ug/h | INTRAVENOUS | Status: AC
  Administered 2014-12-28: 50 ug/h via INTRAVENOUS
  Filled 2014-12-27 (×4): qty 1

## 2014-12-27 MED ORDER — PHENYLEPHRINE HCL 10 MG/ML IJ SOLN
INTRAMUSCULAR | Status: DC | PRN
  Administered 2014-12-27 (×2): .1 ug via INTRAVENOUS

## 2014-12-27 MED ORDER — SODIUM CHLORIDE 0.9 % IV SOLN: Freq: Once | INTRAVENOUS | Status: DC

## 2014-12-27 MED ORDER — PROPOFOL 500 MG/50ML IV EMUL
INTRAVENOUS | Status: DC | PRN
  Administered 2014-12-27: 160 ug/kg/min via INTRAVENOUS

## 2014-12-27 MED ORDER — PROPOFOL 10 MG/ML IV BOLUS
INTRAVENOUS | Status: DC | PRN
Start: 2014-12-27 — End: 2014-12-27
  Administered 2014-12-27: 50 mg via INTRAVENOUS

## 2014-12-27 NOTE — Anesthesia Postprocedure Evaluation (Signed)
  Anesthesia Post-op Note  Patient: Roger Winters  Procedure(s) Performed: Procedure(s): ESOPHAGOGASTRODUODENOSCOPY (EGD) (N/A)  Anesthesia type:General  Patient location: PACU  Post pain: Pain level controlled  Post assessment: Post-op Vital signs reviewed, Patient's Cardiovascular Status Stable, Respiratory Function Stable, Patent Airway and No signs of Nausea or vomiting  Post vital signs: Reviewed and stable  Last Vitals:  Filed Vitals:   12/27/14 1448  BP: 118/65  Pulse: 74  Temp:   Resp: 16    Level of consciousness: awake, alert  and patient cooperative  Complications: No apparent anesthesia complications
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
## 2014-12-27 NOTE — Progress Notes (Signed)
Rn requesti stat cbc check order du eto ? Recurrence of gib  Plan Stat cbc order sent  .Dr. Brand Males, M.D., [facility].C.P Pulmonary and Critical Care Medicine Staff Physician [facility] Pager: [phone], If no answer or between  15:00h - 7:00h: call [phone]  12/27/2014 8:41 PM

## 2014-12-27 NOTE — Progress Notes (Signed)
Placed call to Dr. Candace Cruise to verify the need to continue the octreotide drip.  Pt did have one episode of bleeding.  Per Dr. Candace Cruise, continue drip until the AM.

## 2014-12-27 NOTE — Anesthesia Preprocedure Evaluation (Addendum)
Anesthesia Evaluation   Patient awake    Reviewed: Allergy & Precautions, NPO status , Patient's Chart, lab work & pertinent test results  Airway Mallampati: II  TM Distance: >3 FB Neck ROM: Full    Dental  (+) Teeth Intact   Pulmonary former smoker,    Pulmonary exam normal breath sounds clear to auscultation       Cardiovascular Exercise Tolerance: Poor + CAD and + Peripheral Vascular Disease   Rhythm:Regular Rate:Normal     Neuro/Psych CVA, Residual Symptoms    GI/Hepatic Neg liver ROS,   Endo/Other  diabetes, Well Controlled, Type 2  Renal/GU Renal disease     Musculoskeletal   Abdominal   Peds  Hematology   Anesthesia Other Findings   Reproductive/Obstetrics                            Anesthesia Physical Anesthesia Plan  ASA: IV  Anesthesia Plan: General   Post-op Pain Management:    Induction:   Airway Management Planned: Nasal Cannula  Additional Equipment:   Intra-op Plan:   Post-operative Plan:   Informed Consent: I have reviewed the patients History and Physical, chart, labs and discussed the procedure including the risks, benefits and alternatives for the proposed anesthesia with the patient or authorized representative who has indicated his/her understanding and acceptance.     Plan Discussed with: CRNA  Anesthesia Plan Comments:         Anesthesia Quick Evaluation

## 2014-12-27 NOTE — Progress Notes (Signed)
Paged Elink concerning pt having a large dark red bm with large clots.  Asked about need to recheck hemoglobin level.  A stat CBC was ordered.  Results were called back to elink,  No new orders given.

## 2014-12-27 NOTE — Transfer of Care (Signed)
Immediate Anesthesia Transfer of Care Note  Patient: Roger Winters  Procedure(s) Performed: Procedure(s): ESOPHAGOGASTRODUODENOSCOPY (EGD) (N/A)  Patient Location: PACU and Short Stay  Anesthesia Type:General  Level of Consciousness: awake, oriented and patient cooperative  Airway & Oxygen Therapy: Patient Spontanous Breathing and Patient connected to nasal cannula oxygen  Post-op Assessment: Report given to RN and Post -op Vital signs reviewed and stable  Post vital signs: Reviewed and stable  Last Vitals:  Filed Vitals:   12/27/14 1335  BP: 140/71  Pulse: 66  Temp: 36.3 C  Resp: 17    Complications: No apparent anesthesia complications
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
## 2014-12-27 NOTE — Progress Notes (Signed)
Patient ID: Roger Winters, male   DOB: April 29, 1935, 79 y.o.   MRN: [id number]  I received a page from the nursing staff in regards to the patient's most recent H&H results. Due to a decreasing value, despite the patient receiving packed RBCs earlier, two extra units units of packed RBCs were ordered. Please follow-up H&H.

## 2014-12-27 NOTE — Op Note (Signed)
[facility] Gastroenterology Patient Name: Roger Winters Procedure Date: 12/27/2014 1:47 PM MRN: [id number] Account #: [id number] Date of Birth: 1935-12-18 Admit Type: Inpatient Age: 79 Room: [facility] ENDO ROOM 1 Gender: Male Note Status: Finalized Procedure:         Upper GI endoscopy Indications:       [place] Providers:         Manya Silvas, MD Referring MD:      No Local Md, MD (Referring MD) Medicines:         Propofol per Anesthesia Complications:     No immediate complications. Procedure:         Pre-Anesthesia Assessment:                    - After reviewing the risks and benefits, the patient was                     deemed in satisfactory condition to undergo the procedure.                    After obtaining informed consent, the endoscope was passed                     under direct vision. Throughout the procedure, the                     patient's blood pressure, pulse, and oxygen saturations                     were monitored continuously. The Endoscope was introduced                     through the mouth, and advanced to the second part of                     duodenum. The upper GI endoscopy was accomplished without                     difficulty. The patient tolerated the procedure well. Findings:      What looks to be deep Grade II varices like prominence with no bleeding       were found in the upper third of the esophagus and in the middle third       of the esophagus. These had no stigmata of recent bleeding. heere was a       whitish spot like a possible fibrin plug but it was not convincing       enough to band. The varices had no red wale signs.      Patchy mild inflammation characterized by erythema and granularity was       found in the gastric antrum.      Localized mildly erythematous mucosa without active bleeding and with no       stigmata of bleeding was found in the duodenal bulb. Impression:        - Non-bleeding grade II esophageal  varices.                    - Gastritis.                    - Normal examined duodenum.                    - No specimens collected. Recommendation:    Clear liquid  and advance slowly. Continue Octreotide for                     48 hours, PPI bid instead of drip. Consider capsule                     endoscopy.                    - The findings and recommendations were discussed with the                     patient. Manya Silvas, MD 12/27/2014 2:08:55 PM This report has been signed electronically. Number of Addenda: 0 Note Initiated On: 12/27/2014 1:47 PM      [facility]
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
## 2014-12-28 ENCOUNTER — Encounter: Payer: Self-pay | Admitting: Gastroenterology

## 2014-12-28 LAB — CBC
HCT: 24.5 % — ABNORMAL LOW (ref 40.0–52.0)
HCT: 25 % — ABNORMAL LOW (ref 40.0–52.0)
HEMATOCRIT: 24.6 % — AB (ref 40.0–52.0)
HEMOGLOBIN: 8.2 g/dL — AB (ref 13.0–18.0)
Hemoglobin: 8.1 g/dL — ABNORMAL LOW (ref 13.0–18.0)
Hemoglobin: 8 g/dL — ABNORMAL LOW (ref 13.0–18.0)
MCH: 30.5 pg (ref 26.0–34.0)
MCH: 30.4 pg (ref 26.0–34.0)
MCH: 30.4 pg (ref 26.0–34.0)
MCHC: 33.1 g/dL (ref 32.0–36.0)
MCHC: 32.8 g/dL (ref 32.0–36.0)
MCHC: 32.8 g/dL (ref 32.0–36.0)
MCV: 92.3 fL (ref 80.0–100.0)
MCV: 92.5 fL (ref 80.0–100.0)
MCV: 92.6 fL (ref 80.0–100.0)
PLATELETS: 112 10*3/uL — AB (ref 150–440)
Platelets: 113 10*3/uL — ABNORMAL LOW (ref 150–440)
Platelets: 125 10*3/uL — ABNORMAL LOW (ref 150–440)
RBC: 2.66 MIL/uL — ABNORMAL LOW (ref 4.40–5.90)
RBC: 2.65 MIL/uL — ABNORMAL LOW (ref 4.40–5.90)
RBC: 2.7 MIL/uL — ABNORMAL LOW (ref 4.40–5.90)
RDW: 16.7 % — AB (ref 11.5–14.5)
RDW: 17.4 % — AB (ref 11.5–14.5)
RDW: 17 % — AB (ref 11.5–14.5)
WBC: 5.3 10*3/uL (ref 3.8–10.6)
WBC: 5.5 10*3/uL (ref 3.8–10.6)
WBC: 5.3 10*3/uL (ref 3.8–10.6)

## 2014-12-28 LAB — BASIC METABOLIC PANEL
ANION GAP: 4 — AB (ref 5–15)
BUN: 38 mg/dL — AB (ref 6–20)
CHLORIDE: 119 mmol/L — AB (ref 101–111)
CO2: 18 mmol/L — ABNORMAL LOW (ref 22–32)
CREATININE: 1.34 mg/dL — AB (ref 0.61–1.24)
Calcium: 7.3 mg/dL — ABNORMAL LOW (ref 8.9–10.3)
GFR calc Af Amer: 57 mL/min — ABNORMAL LOW (ref >60)
GFR, EST NON AFRICAN AMERICAN: 49 mL/min — AB (ref >60)
Glucose, Bld: 114 mg/dL — ABNORMAL HIGH (ref 65–99)
Potassium: 4.9 mmol/L (ref 3.5–5.1)
Sodium: 141 mmol/L (ref 135–145)

## 2014-12-28 LAB — URINE CULTURE: Special Requests: NORMAL

## 2014-12-28 LAB — TYPE AND SCREEN
ABO/RH(D): O POS
ANTIBODY SCREEN: NEGATIVE
UNIT DIVISION: 0
UNIT DIVISION: 0
UNIT DIVISION: 0
Unit division: 0

## 2014-12-28 NOTE — Progress Notes (Signed)
79 yo bm transferred from CCU with GIB.  A&O x3, pt non ambulatory secondary to bil BKA.  No distress on ra.  Cardiac monitor placed on pt.  Pt denies chest pain.  Lungs diminished lower lobes bil.  IVF/Sandostatin gtt infusing well rt fa. Suprapubic catheter with yellow urine.  Oriented to room and surroundings.  Denies need at this time.  CB in reach, SR up 2, bed alarm on.

## 2014-12-28 NOTE — Consult Note (Signed)
  GI Inpatient Follow-up Note  Patient Identification: Roger Winters is a 79 y.o. male with recent UGI bleeding. Covering for Dr. Vira Agar.  Subjective: EGD yesterday showed esophageal varices but no obvious bleeding source. Hgb dropped this AM. Pt denies any bleeding overnight. No abdominal pain. Eating solids.  Scheduled Inpatient Medications:  . allopurinol  200 mg Oral Daily  . cefTRIAXone (ROCEPHIN)  IV  1 g Intravenous Q24H  . docusate sodium  100 mg Oral BID  . fluticasone  2 spray Each Nare Daily  . pantoprazole  40 mg Oral BID  . simvastatin  20 mg Oral q1800  . sodium chloride  3 mL Intravenous Q12H  . vitamin C  500 mg Oral BID  . Vitamin D (Ergocalciferol)  50,000 Units Oral Q30 days    Continuous Inpatient Infusions:   . sodium chloride 125 mL/hr at 12/26/14 1423  . octreotide  (SANDOSTATIN)    IV infusion 50 mcg/hr (12/28/14 0358)    PRN Inpatient Medications:  acetaminophen **OR** acetaminophen, alum & mag hydroxide-simeth, baclofen, bisacodyl, HYDROcodone-acetaminophen, loperamide, ondansetron **OR** ondansetron (ZOFRAN) IV, traZODone  Review of Systems: Constitutional: Weight is stable.  Eyes: No changes in vision. ENT: No oral lesions, sore throat.  GI: see HPI.  Heme/Lymph: No easy bruising.  CV: No chest pain.  GU: No hematuria.  Integumentary: No rashes.  Neuro: No headaches.  Psych: No depression/anxiety.  Endocrine: No heat/cold intolerance.  Allergic/Immunologic: No urticaria.  Resp: No cough, SOB.  Musculoskeletal: No joint swelling.    Physical Examination: BP 140/66 mmHg  Pulse 69  Temp(Src) 98.2 F (36.8 C) (Oral)  Resp 16  Ht 4' (1.219 m)  Wt 76.4 kg (168 lb 6.9 oz)  BMI 51.41 kg/m2  SpO2 98% Gen: NAD, alert and oriented x 4 HEENT: PEERLA, EOMI, Neck: supple, no JVD or thyromegaly Chest: CTA bilaterally, no wheezes, crackles, or other adventitious sounds CV: RRR, no m/g/c/r Abd: soft, NT, ND, +BS in all four quadrants; no HSM,  guarding, ridigity, or rebound tenderness Ext: no edema, well perfused with 2+ pulses, Skin: no rash or lesions noted Lymph: no LAD  Data: Lab Results  Component Value Date   WBC 5.3 12/28/2014   HGB 8.1* 12/28/2014   HCT 24.6* 12/28/2014   MCV 92.3 12/28/2014   PLT 113* 12/28/2014    Recent Labs Lab 12/27/14 1759 12/27/14 2110 12/28/14 0435  HGB 9.7* 9.5* 8.1*   Lab Results  Component Value Date   NA 141 12/28/2014   K 4.9 12/28/2014   CL 119* 12/28/2014   CO2 18* 12/28/2014   BUN 38* 12/28/2014   CREATININE 1.34* 12/28/2014   Lab Results  Component Value Date   ALT 7* 12/27/2014   AST 10* 12/27/2014   ALKPHOS 44 12/27/2014   BILITOT 0.6 12/27/2014    Recent Labs Lab 12/27/14 0400  APTT 30  INR 1.38   Assessment/Plan: Roger Winters is a 79 y.o. male with UGI bleeding without obvious source.   Recommendations: Repeat CBC in AM. If hgb drops further, then repeat EGD tomorrow AM. Continue octreotide for at least another 24 hours. Thanks. Please call with questions or concerns.  Valerye Kobus, Lupita Dawn, MD
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
## 2014-12-28 NOTE — Progress Notes (Signed)
No new complaints No distress  Filed Vitals:   12/28/14 1000 12/28/14 1100 12/28/14 1200 12/28/14 1300  BP: 126/67 126/69 119/69 152/90  Pulse: 68 59 64 73  Temp:      TempSrc:      Resp: 34 14 14 19   Height:      Weight:      SpO2: 97% 95% 98% 99%   No distress Chest clear Reg, no M NABS, soft B AKA noted  BMP Latest Ref Rng 12/28/2014 12/27/2014 12/26/2014  Glucose 65 - 99 mg/dL 114(H) 136(H) 85  BUN 6 - 20 mg/dL 38(H) 55(H) 62(H)  Creatinine 0.61 - 1.24 mg/dL 1.34(H) 1.52(H) 1.58(H)  Sodium 135 - 145 mmol/L 141 138 136  Potassium 3.5 - 5.1 mmol/L 4.9 5.2(H) 5.7(H)  Chloride 101 - 111 mmol/L 119(H) 117(H) 110  CO2 22 - 32 mmol/L 18(L) 18(L) 18(L)  Calcium 8.9 - 10.3 mg/dL 7.3(L) 7.3(L) 7.8(L)    CBC Latest Ref Rng 12/28/2014 12/28/2014 12/27/2014  WBC 3.8 - 10.6 K/uL 5.5 5.3 7.2  Hemoglobin 13.0 - 18.0 g/dL 8.0(L) 8.1(L) 9.5(L)  Hematocrit 40.0 - 52.0 % 24.5(L) 24.6(L) 28.9(L)  Platelets 150 - 440 K/uL 112(L) 113(L) 130(L)   No new CXR  IMPRESSION: UGIB  Gastritis Nonbleeding esophageal varices AKI resolving Hypertension, controlled  PLAN/REC: Advance diet per GI recs Cont octreotide per GI recs Discussed with Dr Tressia Miners - transfer to telemetry.   PCCM will sign off. Please call if we can be of further assistance  Merton Border, MD PCCM service Mobile [phone] Pager [phone]
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
## 2014-12-29 ENCOUNTER — Encounter: Payer: Self-pay | Admitting: Unknown Physician Specialty

## 2014-12-29 LAB — BASIC METABOLIC PANEL
Anion gap: 5 (ref 5–15)
BUN: 24 mg/dL — AB (ref 6–20)
CHLORIDE: 114 mmol/L — AB (ref 101–111)
CO2: 19 mmol/L — ABNORMAL LOW (ref 22–32)
Calcium: 8 mg/dL — ABNORMAL LOW (ref 8.9–10.3)
Creatinine, Ser: 1.24 mg/dL (ref 0.61–1.24)
GFR, EST NON AFRICAN AMERICAN: 54 mL/min — AB (ref >60)
Glucose, Bld: 112 mg/dL — ABNORMAL HIGH (ref 65–99)
POTASSIUM: 4.6 mmol/L (ref 3.5–5.1)
SODIUM: 138 mmol/L (ref 135–145)

## 2014-12-29 LAB — CBC
HEMATOCRIT: 24.5 % — AB (ref 40.0–52.0)
Hemoglobin: 8.3 g/dL — ABNORMAL LOW (ref 13.0–18.0)
MCH: 31.3 pg (ref 26.0–34.0)
MCHC: 33.7 g/dL (ref 32.0–36.0)
MCV: 92.9 fL (ref 80.0–100.0)
Platelets: 132 10*3/uL — ABNORMAL LOW (ref 150–440)
RBC: 2.64 MIL/uL — AB (ref 4.40–5.90)
RDW: 16.8 % — ABNORMAL HIGH (ref 11.5–14.5)
WBC: 6.8 10*3/uL (ref 3.8–10.6)

## 2014-12-29 MED ORDER — CEPHALEXIN 500 MG PO CAPS
500.0000 mg | ORAL_CAPSULE | Freq: Two times a day (BID) | ORAL | Status: DC
  Administered 2014-12-29 – 2014-12-30 (×3): 500 mg via ORAL
  Filled 2014-12-29 (×3): qty 1

## 2014-12-29 MED ORDER — SODIUM CHLORIDE 0.9 % IV SOLN
25.0000 ug/h | INTRAVENOUS | Status: AC
  Administered 2014-12-29: 50 ug/h via INTRAVENOUS
  Filled 2014-12-29 (×4): qty 1

## 2014-12-29 NOTE — Progress Notes (Signed)
Paced. Room air. Takes meds ok. Orestatin drip rate decreased to 12.5. Diet was advanced to full liquid. Pt has not reported any pain. Incontinent. A & O.  Suprapubic cath. Pt has no further concerns at this time.
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
## 2014-12-29 NOTE — Consult Note (Signed)
  No signs of rebleeding. Hgb stable. No abdominal pain. Tolerating diet. Ok to start tapering off octreotide. Consider starting nadolol 40mg  daily tomorrow to lower portal pressures, since pt's BP elevated. Agree with advancing diet. Dr. Vira Agar will see pt tomorrow. thanks

## 2014-12-30 MED ORDER — CEPHALEXIN 500 MG PO CAPS: 500.0000 mg | ORAL_CAPSULE | Freq: Two times a day (BID) | ORAL | Status: DC

## 2014-12-30 MED ORDER — PANTOPRAZOLE SODIUM 40 MG PO TBEC: 40.0000 mg | DELAYED_RELEASE_TABLET | Freq: Two times a day (BID) | ORAL | Status: AC

## 2014-12-30 NOTE — Progress Notes (Signed)
Patient discharged with cell phone, keys, and sweater. Discharge papers reviewed, patient verbalized understanding and signed. Paper prescriptions given to patient. Patient discharged on room air and taken off floor by [facility] transport via wheelchair.
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
## 2014-12-30 NOTE — Care Management Important Message (Signed)
Important Message  Patient Details  Name: Roger Winters MRN: [id number] Date of Birth: 1935-05-17   Medicare Important Message Given:  Yes-second notification given    Juliann Pulse A Charron Coultas 12/30/2014, 10:01 AM

## 2014-12-30 NOTE — Care Management (Signed)
Patient is followed by [facility].  notified agency of discharge today.  Agency to transport

## 2014-12-30 NOTE — Discharge Instructions (Signed)
Soft diet Avoid NSAIDS

## 2014-12-30 NOTE — Progress Notes (Signed)
Called to pharmacy to obtain patients personal home medications. However per pharmacy tech no medications have been stored in pharmacy. Checked patient room and medication bin however unable to find any medications. Per patient medications were in bubble packs. Security was called however nothing on file with security. Also called supervisor to make aware, per ann due a safety zone portal

## 2015-04-25 ENCOUNTER — Encounter: Payer: Self-pay | Admitting: Emergency Medicine

## 2015-04-25 ENCOUNTER — Emergency Department
Admission: EM | Admit: 2015-04-25 | Discharge: 2015-04-25 | Disposition: A | Discharge status: Home / Self Care | Payer: Medicare (Managed Care) | Attending: Emergency Medicine | Admitting: Emergency Medicine

## 2015-04-25 DIAGNOSIS — E119 Type 2 diabetes mellitus without complications: Secondary | ICD-10-CM | POA: Insufficient documentation

## 2015-04-25 DIAGNOSIS — T83010A Breakdown (mechanical) of cystostomy catheter, initial encounter: Secondary | ICD-10-CM

## 2015-04-25 DIAGNOSIS — Y658 Other specified misadventures during surgical and medical care: Secondary | ICD-10-CM | POA: Insufficient documentation

## 2015-04-25 DIAGNOSIS — T83098A Other mechanical complication of other indwelling urethral catheter, initial encounter: Secondary | ICD-10-CM | POA: Insufficient documentation

## 2015-04-25 DIAGNOSIS — Z7984 Long term (current) use of oral hypoglycemic drugs: Secondary | ICD-10-CM | POA: Insufficient documentation

## 2015-04-25 DIAGNOSIS — Z87891 Personal history of nicotine dependence: Secondary | ICD-10-CM | POA: Insufficient documentation

## 2015-04-25 DIAGNOSIS — Z79899 Other long term (current) drug therapy: Secondary | ICD-10-CM | POA: Insufficient documentation

## 2015-04-25 DIAGNOSIS — Z792 Long term (current) use of antibiotics: Secondary | ICD-10-CM | POA: Diagnosis not present

## 2015-04-25 LAB — URINALYSIS COMPLETE WITH MICROSCOPIC (ARMC ONLY)
BACTERIA UA: NONE SEEN
Bilirubin Urine: NEGATIVE
GLUCOSE, UA: NEGATIVE mg/dL
Ketones, ur: NEGATIVE mg/dL
Nitrite: NEGATIVE
Protein, ur: NEGATIVE mg/dL
SQUAMOUS EPITHELIAL / LPF: NONE SEEN
Specific Gravity, Urine: 1.006 (ref 1.005–1.030)
pH: 5 (ref 5.0–8.0)

## 2015-04-25 NOTE — Discharge Instructions (Signed)
SEEK IMMEDIATE MEDICAL CARE IF:  You have chills, nausea, abdominal pain, or back pain.  Your catheter comes out.  You have blood in your urine.  You have no urine flow for 1 hour.  You have a fever. Or any other new concerns arise

## 2015-04-25 NOTE — ED Notes (Signed)
71fr suprapubic cath inserted by dr Jacqualine Code. Pt tolerated well.  Clear yellow urine draining.

## 2015-04-25 NOTE — ED Notes (Signed)
Patient has a supra pubic catheter and patient was in PCP's today to have the catheter changed (DR. Reily).  Office was unable to successfully place new suprapubic catheter.
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
## 2015-04-25 NOTE — ED Notes (Signed)
md in with pt
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
## 2015-04-26 ENCOUNTER — Encounter: Payer: Self-pay | Admitting: Emergency Medicine

## 2015-04-26 ENCOUNTER — Emergency Department
Admission: EM | Admit: 2015-04-26 | Discharge: 2015-04-26 | Disposition: A | Discharge status: Home / Self Care | Payer: Medicare (Managed Care) | Attending: Emergency Medicine | Admitting: Emergency Medicine

## 2015-04-26 DIAGNOSIS — Z7984 Long term (current) use of oral hypoglycemic drugs: Secondary | ICD-10-CM | POA: Insufficient documentation

## 2015-04-26 DIAGNOSIS — Z7951 Long term (current) use of inhaled steroids: Secondary | ICD-10-CM | POA: Insufficient documentation

## 2015-04-26 DIAGNOSIS — E119 Type 2 diabetes mellitus without complications: Secondary | ICD-10-CM | POA: Diagnosis not present

## 2015-04-26 DIAGNOSIS — Z87891 Personal history of nicotine dependence: Secondary | ICD-10-CM | POA: Insufficient documentation

## 2015-04-26 DIAGNOSIS — Y658 Other specified misadventures during surgical and medical care: Secondary | ICD-10-CM | POA: Insufficient documentation

## 2015-04-26 DIAGNOSIS — Z792 Long term (current) use of antibiotics: Secondary | ICD-10-CM | POA: Diagnosis not present

## 2015-04-26 DIAGNOSIS — T83098A Other mechanical complication of other indwelling urethral catheter, initial encounter: Secondary | ICD-10-CM | POA: Diagnosis present

## 2015-04-26 DIAGNOSIS — T83010A Breakdown (mechanical) of cystostomy catheter, initial encounter: Secondary | ICD-10-CM

## 2015-04-26 DIAGNOSIS — Z79899 Other long term (current) drug therapy: Secondary | ICD-10-CM | POA: Diagnosis not present

## 2015-04-26 NOTE — ED Notes (Signed)
Pt reports he was having suprapubic catheter changed and staff at [facility] healthcare unable to place catheter.  PT denies pain.

## 2015-04-26 NOTE — ED Notes (Signed)
EMS at bedside to transport pt home.

## 2015-04-26 NOTE — ED Notes (Signed)
Pt c/o urine leaking from stoma.  Abd pads provided to pt.
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
## 2015-04-26 NOTE — Discharge Instructions (Signed)
As we discussed, you need to follow-up either with your regular urologist in [facility] or with Dr. Erlene Quan or one of her associates in [place].  The on-call urologist tonight, Dr. Alyson Ingles, recommended that we place a Foley catheter in your bladder through your penis given the difficulties you have had with your suprapubic site over the last 24 hours.  Your urologist will be able to make sure the tract through your abdomen is well-established and safe to use.  Return to the emergency department with new or worsening symptoms that concern you.

## 2015-04-26 NOTE — ED Notes (Signed)
Dr. Karma Greaser and this nurse at bedside to attempt placing suprapubic catheter.  Attempt unsuccessful.  Dr. Karma Greaser consulted urology.  Per Dr. Karma Greaser, place foley catheter via penis and pt will follow up with urology.

## 2015-04-26 NOTE — ED Notes (Signed)
Attempted to place 50fr foley catheter after consultation with Dr. Karma Greaser and charge nurse.  Unsuccessful attempt, some bleeding noted, stoma dressed w/ abd pad.

## 2015-04-26 NOTE — ED Notes (Signed)
Pt requesting leg bag, catheter changed to leg bag.  Pt states he does not want to keep standard drainage bag.  Bag disposed.  Pt assisted with changing into clean shorts.

## 2015-04-27 LAB — URINE CULTURE: SPECIAL REQUESTS: NORMAL

## 2015-05-07 ENCOUNTER — Encounter: Payer: Self-pay | Admitting: Emergency Medicine

## 2015-05-07 ENCOUNTER — Emergency Department
Admission: EM | Admit: 2015-05-07 | Discharge: 2015-05-07 | Disposition: A | Discharge status: Home / Self Care | Payer: Medicare (Managed Care) | Attending: Emergency Medicine | Admitting: Emergency Medicine

## 2015-05-07 DIAGNOSIS — E119 Type 2 diabetes mellitus without complications: Secondary | ICD-10-CM | POA: Insufficient documentation

## 2015-05-07 DIAGNOSIS — T83098S Other mechanical complication of other indwelling urethral catheter, sequela: Secondary | ICD-10-CM | POA: Diagnosis not present

## 2015-05-07 DIAGNOSIS — Z79899 Other long term (current) drug therapy: Secondary | ICD-10-CM | POA: Diagnosis not present

## 2015-05-07 DIAGNOSIS — Z792 Long term (current) use of antibiotics: Secondary | ICD-10-CM | POA: Insufficient documentation

## 2015-05-07 DIAGNOSIS — Z89612 Acquired absence of left leg above knee: Secondary | ICD-10-CM | POA: Diagnosis not present

## 2015-05-07 DIAGNOSIS — Z7984 Long term (current) use of oral hypoglycemic drugs: Secondary | ICD-10-CM | POA: Diagnosis not present

## 2015-05-07 DIAGNOSIS — Z89611 Acquired absence of right leg above knee: Secondary | ICD-10-CM | POA: Insufficient documentation

## 2015-05-07 DIAGNOSIS — Z87891 Personal history of nicotine dependence: Secondary | ICD-10-CM | POA: Insufficient documentation

## 2015-05-07 DIAGNOSIS — R351 Nocturia: Secondary | ICD-10-CM | POA: Diagnosis present

## 2015-05-07 DIAGNOSIS — Y658 Other specified misadventures during surgical and medical care: Secondary | ICD-10-CM | POA: Insufficient documentation

## 2015-05-07 DIAGNOSIS — T839XXS Unspecified complication of genitourinary prosthetic device, implant and graft, sequela: Secondary | ICD-10-CM

## 2015-05-07 LAB — URINALYSIS COMPLETE WITH MICROSCOPIC (ARMC ONLY)
BILIRUBIN URINE: NEGATIVE
Bacteria, UA: NONE SEEN
GLUCOSE, UA: NEGATIVE mg/dL
KETONES UR: NEGATIVE mg/dL
NITRITE: NEGATIVE
Protein, ur: NEGATIVE mg/dL
SPECIFIC GRAVITY, URINE: 1.015 (ref 1.005–1.030)
pH: 5 (ref 5.0–8.0)

## 2015-05-07 NOTE — ED Notes (Signed)
Pt has long-term suprapubic catheter, but now has a urethral catheter.  Pt went for catheter change this morning at 0830 and has had no urine output since then.  Pt is paralyzed from chest down and cannot feel pain at bladder area, but states he has been having chills and that something is not right.
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
## 2015-09-03 ENCOUNTER — Encounter: Payer: Self-pay | Admitting: Emergency Medicine

## 2015-09-03 ENCOUNTER — Emergency Department
Admission: EM | Admit: 2015-09-03 | Discharge: 2015-09-03 | Disposition: A | Discharge status: Other Acute Inpatient Hospital | Payer: Medicare (Managed Care) | Attending: Emergency Medicine | Admitting: Emergency Medicine

## 2015-09-03 ENCOUNTER — Emergency Department: Payer: Medicare (Managed Care)

## 2015-09-03 DIAGNOSIS — E1122 Type 2 diabetes mellitus with diabetic chronic kidney disease: Secondary | ICD-10-CM | POA: Diagnosis not present

## 2015-09-03 DIAGNOSIS — Z8679 Personal history of other diseases of the circulatory system: Secondary | ICD-10-CM | POA: Insufficient documentation

## 2015-09-03 DIAGNOSIS — K921 Melena: Secondary | ICD-10-CM | POA: Insufficient documentation

## 2015-09-03 DIAGNOSIS — Z7984 Long term (current) use of oral hypoglycemic drugs: Secondary | ICD-10-CM | POA: Diagnosis not present

## 2015-09-03 DIAGNOSIS — Z7982 Long term (current) use of aspirin: Secondary | ICD-10-CM | POA: Diagnosis not present

## 2015-09-03 DIAGNOSIS — I2510 Atherosclerotic heart disease of native coronary artery without angina pectoris: Secondary | ICD-10-CM | POA: Insufficient documentation

## 2015-09-03 DIAGNOSIS — I4891 Unspecified atrial fibrillation: Secondary | ICD-10-CM | POA: Diagnosis not present

## 2015-09-03 DIAGNOSIS — N189 Chronic kidney disease, unspecified: Secondary | ICD-10-CM | POA: Insufficient documentation

## 2015-09-03 DIAGNOSIS — K920 Hematemesis: Secondary | ICD-10-CM

## 2015-09-03 LAB — COMPREHENSIVE METABOLIC PANEL
ALBUMIN: 3.7 g/dL (ref 3.5–5.0)
ALK PHOS: 75 U/L (ref 38–126)
ALT: 8 U/L — ABNORMAL LOW (ref 17–63)
ANION GAP: 8 (ref 5–15)
AST: 18 U/L (ref 15–41)
BILIRUBIN TOTAL: 0.6 mg/dL (ref 0.3–1.2)
BUN: 31 mg/dL — AB (ref 6–20)
CALCIUM: 9.3 mg/dL (ref 8.9–10.3)
CO2: 23 mmol/L (ref 22–32)
Chloride: 105 mmol/L (ref 101–111)
Creatinine, Ser: 1.87 mg/dL — ABNORMAL HIGH (ref 0.61–1.24)
GFR calc Af Amer: 38 mL/min — ABNORMAL LOW (ref >60)
GFR, EST NON AFRICAN AMERICAN: 33 mL/min — AB (ref >60)
GLUCOSE: 115 mg/dL — AB (ref 65–99)
POTASSIUM: 4.7 mmol/L (ref 3.5–5.1)
Sodium: 136 mmol/L (ref 135–145)
TOTAL PROTEIN: 7.3 g/dL (ref 6.5–8.1)

## 2015-09-03 LAB — CBC
HEMATOCRIT: 31.9 % — AB (ref 40.0–52.0)
HEMOGLOBIN: 10.8 g/dL — AB (ref 13.0–18.0)
MCH: 30.5 pg (ref 26.0–34.0)
MCHC: 33.9 g/dL (ref 32.0–36.0)
MCV: 90.1 fL (ref 80.0–100.0)
Platelets: 269 10*3/uL (ref 150–440)
RBC: 3.55 MIL/uL — ABNORMAL LOW (ref 4.40–5.90)
RDW: 17.7 % — AB (ref 11.5–14.5)
WBC: 5.6 10*3/uL (ref 3.8–10.6)

## 2015-09-03 MED ORDER — FAMOTIDINE IN NACL 20-0.9 MG/50ML-% IV SOLN
20.0000 mg | Freq: Once | INTRAVENOUS | Status: AC
  Administered 2015-09-03: 20 mg via INTRAVENOUS
  Filled 2015-09-03: qty 50
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
## 2015-09-03 NOTE — ED Notes (Signed)
Pt arrived from home by EMS with c/o coughing up blood. EMS reports pt was awoke from sleep with bright red blood in mouth. Pt A&O x4 upon arrival to ED. Hx of DM.

## 2015-12-11 ENCOUNTER — Ambulatory Visit: Payer: Medicare (Managed Care) | Admitting: Cardiovascular Disease

## 2016-09-12 ENCOUNTER — Observation Stay
Admission: EM | Admit: 2016-09-12 | Discharge: 2016-09-13 | Disposition: A | Discharge status: Home / Self Care | Payer: Medicare (Managed Care) | Attending: Internal Medicine | Admitting: Internal Medicine

## 2016-09-12 ENCOUNTER — Encounter: Payer: Self-pay | Admitting: Emergency Medicine

## 2016-09-12 ENCOUNTER — Emergency Department: Payer: Medicare (Managed Care)

## 2016-09-12 DIAGNOSIS — N179 Acute kidney failure, unspecified: Principal | ICD-10-CM | POA: Insufficient documentation

## 2016-09-12 DIAGNOSIS — Z7984 Long term (current) use of oral hypoglycemic drugs: Secondary | ICD-10-CM | POA: Insufficient documentation

## 2016-09-12 DIAGNOSIS — I129 Hypertensive chronic kidney disease with stage 1 through stage 4 chronic kidney disease, or unspecified chronic kidney disease: Secondary | ICD-10-CM | POA: Diagnosis not present

## 2016-09-12 DIAGNOSIS — Z89612 Acquired absence of left leg above knee: Secondary | ICD-10-CM | POA: Insufficient documentation

## 2016-09-12 DIAGNOSIS — Z7982 Long term (current) use of aspirin: Secondary | ICD-10-CM | POA: Insufficient documentation

## 2016-09-12 DIAGNOSIS — R748 Abnormal levels of other serum enzymes: Secondary | ICD-10-CM | POA: Insufficient documentation

## 2016-09-12 DIAGNOSIS — N183 Chronic kidney disease, stage 3 (moderate): Secondary | ICD-10-CM | POA: Diagnosis not present

## 2016-09-12 DIAGNOSIS — N319 Neuromuscular dysfunction of bladder, unspecified: Secondary | ICD-10-CM | POA: Diagnosis not present

## 2016-09-12 DIAGNOSIS — E860 Dehydration: Secondary | ICD-10-CM | POA: Insufficient documentation

## 2016-09-12 DIAGNOSIS — I208 Other forms of angina pectoris: Secondary | ICD-10-CM

## 2016-09-12 DIAGNOSIS — K219 Gastro-esophageal reflux disease without esophagitis: Secondary | ICD-10-CM | POA: Insufficient documentation

## 2016-09-12 DIAGNOSIS — Z955 Presence of coronary angioplasty implant and graft: Secondary | ICD-10-CM | POA: Diagnosis not present

## 2016-09-12 DIAGNOSIS — Z89611 Acquired absence of right leg above knee: Secondary | ICD-10-CM | POA: Diagnosis not present

## 2016-09-12 DIAGNOSIS — Z8673 Personal history of transient ischemic attack (TIA), and cerebral infarction without residual deficits: Secondary | ICD-10-CM | POA: Diagnosis not present

## 2016-09-12 DIAGNOSIS — Z993 Dependence on wheelchair: Secondary | ICD-10-CM | POA: Diagnosis not present

## 2016-09-12 DIAGNOSIS — G9341 Metabolic encephalopathy: Secondary | ICD-10-CM | POA: Diagnosis not present

## 2016-09-12 DIAGNOSIS — G8220 Paraplegia, unspecified: Secondary | ICD-10-CM | POA: Insufficient documentation

## 2016-09-12 DIAGNOSIS — E1151 Type 2 diabetes mellitus with diabetic peripheral angiopathy without gangrene: Secondary | ICD-10-CM | POA: Insufficient documentation

## 2016-09-12 DIAGNOSIS — Z87891 Personal history of nicotine dependence: Secondary | ICD-10-CM | POA: Insufficient documentation

## 2016-09-12 DIAGNOSIS — Z950 Presence of cardiac pacemaker: Secondary | ICD-10-CM | POA: Diagnosis not present

## 2016-09-12 DIAGNOSIS — Z87828 Personal history of other (healed) physical injury and trauma: Secondary | ICD-10-CM | POA: Diagnosis not present

## 2016-09-12 DIAGNOSIS — R7989 Other specified abnormal findings of blood chemistry: Secondary | ICD-10-CM

## 2016-09-12 DIAGNOSIS — R4182 Altered mental status, unspecified: Secondary | ICD-10-CM | POA: Diagnosis present

## 2016-09-12 DIAGNOSIS — E1122 Type 2 diabetes mellitus with diabetic chronic kidney disease: Secondary | ICD-10-CM | POA: Diagnosis not present

## 2016-09-12 DIAGNOSIS — I2510 Atherosclerotic heart disease of native coronary artery without angina pectoris: Secondary | ICD-10-CM | POA: Insufficient documentation

## 2016-09-12 DIAGNOSIS — I482 Chronic atrial fibrillation: Secondary | ICD-10-CM | POA: Insufficient documentation

## 2016-09-12 DIAGNOSIS — R110 Nausea: Secondary | ICD-10-CM | POA: Diagnosis present

## 2016-09-12 DIAGNOSIS — R410 Disorientation, unspecified: Secondary | ICD-10-CM

## 2016-09-12 DIAGNOSIS — R778 Other specified abnormalities of plasma proteins: Secondary | ICD-10-CM

## 2016-09-12 LAB — CBC WITH DIFFERENTIAL/PLATELET
Basophils Absolute: 0 10*3/uL (ref 0–0.1)
Basophils Relative: 1 %
EOS ABS: 0 10*3/uL (ref 0–0.7)
Eosinophils Relative: 1 %
HEMATOCRIT: 32.9 % — AB (ref 40.0–52.0)
HEMOGLOBIN: 11.2 g/dL — AB (ref 13.0–18.0)
LYMPHS ABS: 1.2 10*3/uL (ref 1.0–3.6)
Lymphocytes Relative: 23 %
MCH: 30.8 pg (ref 26.0–34.0)
MCHC: 34.1 g/dL (ref 32.0–36.0)
MCV: 90.3 fL (ref 80.0–100.0)
MONOS PCT: 6 %
Monocytes Absolute: 0.3 10*3/uL (ref 0.2–1.0)
NEUTROS ABS: 3.7 10*3/uL (ref 1.4–6.5)
NEUTROS PCT: 69 %
Platelets: 271 10*3/uL (ref 150–440)
RBC: 3.64 MIL/uL — ABNORMAL LOW (ref 4.40–5.90)
RDW: 17.4 % — ABNORMAL HIGH (ref 11.5–14.5)
WBC: 5.3 10*3/uL (ref 3.8–10.6)

## 2016-09-12 LAB — COMPREHENSIVE METABOLIC PANEL
ALBUMIN: 4.2 g/dL (ref 3.5–5.0)
ALK PHOS: 70 U/L (ref 38–126)
ALT: 15 U/L — ABNORMAL LOW (ref 17–63)
ANION GAP: 9 (ref 5–15)
AST: 29 U/L (ref 15–41)
BUN: 65 mg/dL — AB (ref 6–20)
CALCIUM: 9.4 mg/dL (ref 8.9–10.3)
CO2: 23 mmol/L (ref 22–32)
Chloride: 108 mmol/L (ref 101–111)
Creatinine, Ser: 1.63 mg/dL — ABNORMAL HIGH (ref 0.61–1.24)
GFR calc Af Amer: 45 mL/min — ABNORMAL LOW (ref >60)
GFR calc non Af Amer: 38 mL/min — ABNORMAL LOW (ref >60)
GLUCOSE: 114 mg/dL — AB (ref 65–99)
Potassium: 4.5 mmol/L (ref 3.5–5.1)
Sodium: 140 mmol/L (ref 135–145)
Total Bilirubin: 0.8 mg/dL (ref 0.3–1.2)
Total Protein: 8 g/dL (ref 6.5–8.1)

## 2016-09-12 LAB — TROPONIN I
TROPONIN I: 0.07 ng/mL — AB (ref <0.03)
Troponin I: 0.07 ng/mL (ref <0.03)

## 2016-09-12 LAB — URINALYSIS, COMPLETE (UACMP) WITH MICROSCOPIC
BILIRUBIN URINE: NEGATIVE
Bacteria, UA: NONE SEEN
Glucose, UA: NEGATIVE mg/dL
Ketones, ur: NEGATIVE mg/dL
NITRITE: NEGATIVE
PH: 5 (ref 5.0–8.0)
Protein, ur: 30 mg/dL — AB
SPECIFIC GRAVITY, URINE: 1.015 (ref 1.005–1.030)

## 2016-09-12 LAB — GLUCOSE, CAPILLARY: GLUCOSE-CAPILLARY: 108 mg/dL — AB (ref 65–99)

## 2016-09-12 MED ORDER — TRAMADOL HCL 50 MG PO TABS: 50.0000 mg | ORAL_TABLET | Freq: Four times a day (QID) | ORAL | Status: DC | PRN

## 2016-09-12 MED ORDER — ATORVASTATIN CALCIUM 20 MG PO TABS
40.0000 mg | ORAL_TABLET | Freq: Every day | ORAL | Status: DC
  Administered 2016-09-13: 40 mg via ORAL
  Filled 2016-09-12: qty 2

## 2016-09-12 MED ORDER — POLYETHYLENE GLYCOL 3350 17 G PO PACK: 17.0000 g | PACK | Freq: Every day | ORAL | Status: DC | PRN

## 2016-09-12 MED ORDER — FERROUS SULFATE 325 (65 FE) MG PO TABS
325.0000 mg | ORAL_TABLET | ORAL | Status: DC
  Administered 2016-09-13: 325 mg via ORAL
  Filled 2016-09-12: qty 1

## 2016-09-12 MED ORDER — VITAMIN C 500 MG PO TABS
500.0000 mg | ORAL_TABLET | ORAL | Status: DC
  Administered 2016-09-13: 500 mg via ORAL
  Filled 2016-09-12: qty 1

## 2016-09-12 MED ORDER — ACETAMINOPHEN 650 MG RE SUPP: 650.0000 mg | Freq: Four times a day (QID) | RECTAL | Status: DC | PRN

## 2016-09-12 MED ORDER — CHLORHEXIDINE GLUCONATE 0.12 % MT SOLN
15.0000 mL | Freq: Two times a day (BID) | OROMUCOSAL | Status: DC
  Administered 2016-09-12 – 2016-09-13 (×2): 15 mL via OROMUCOSAL
  Filled 2016-09-12 (×2): qty 15

## 2016-09-12 MED ORDER — ONDANSETRON HCL 4 MG PO TABS: 4.0000 mg | ORAL_TABLET | Freq: Three times a day (TID) | ORAL | Status: DC | PRN

## 2016-09-12 MED ORDER — BACLOFEN 5 MG HALF TABLET
5.0000 mg | ORAL_TABLET | Freq: Every day | ORAL | Status: DC | PRN
  Administered 2016-09-13: 5 mg via ORAL
  Filled 2016-09-12 (×2): qty 1

## 2016-09-12 MED ORDER — HEPARIN SODIUM (PORCINE) 5000 UNIT/ML IJ SOLN
5000.0000 [IU] | Freq: Three times a day (TID) | INTRAMUSCULAR | Status: DC
  Administered 2016-09-12 – 2016-09-13 (×2): 5000 [IU] via SUBCUTANEOUS
  Filled 2016-09-12 (×2): qty 1

## 2016-09-12 MED ORDER — ONDANSETRON HCL 4 MG/2ML IJ SOLN: 4.0000 mg | Freq: Four times a day (QID) | INTRAMUSCULAR | Status: DC | PRN

## 2016-09-12 MED ORDER — ADULT MULTIVITAMIN W/MINERALS CH
1.0000 | ORAL_TABLET | Freq: Every day | ORAL | Status: DC
  Administered 2016-09-13: 1 via ORAL
  Filled 2016-09-12: qty 1

## 2016-09-12 MED ORDER — ONDANSETRON HCL 4 MG PO TABS
4.0000 mg | ORAL_TABLET | Freq: Four times a day (QID) | ORAL | Status: DC | PRN
  Administered 2016-09-12: 4 mg via ORAL
  Filled 2016-09-12: qty 1

## 2016-09-12 MED ORDER — ORAL CARE MOUTH RINSE
15.0000 mL | Freq: Two times a day (BID) | OROMUCOSAL | Status: DC
  Administered 2016-09-13: 15 mL via OROMUCOSAL

## 2016-09-12 MED ORDER — PANTOPRAZOLE SODIUM 40 MG PO TBEC
40.0000 mg | DELAYED_RELEASE_TABLET | Freq: Two times a day (BID) | ORAL | Status: DC
  Administered 2016-09-12 – 2016-09-13 (×2): 40 mg via ORAL
  Filled 2016-09-12 (×2): qty 1

## 2016-09-12 MED ORDER — SODIUM CHLORIDE 0.9 % IV SOLN
Freq: Once | INTRAVENOUS | Status: AC
  Administered 2016-09-12: 19:00:00 via INTRAVENOUS

## 2016-09-12 MED ORDER — ENOXAPARIN SODIUM 30 MG/0.3ML ~~LOC~~ SOLN: 30.0000 mg | SUBCUTANEOUS | Status: DC

## 2016-09-12 MED ORDER — ALLOPURINOL 100 MG PO TABS
200.0000 mg | ORAL_TABLET | Freq: Every day | ORAL | Status: DC
  Administered 2016-09-13: 200 mg via ORAL
  Filled 2016-09-12: qty 2

## 2016-09-12 MED ORDER — ACETAMINOPHEN 325 MG PO TABS: 650.0000 mg | ORAL_TABLET | Freq: Four times a day (QID) | ORAL | Status: DC | PRN

## 2016-09-12 MED ORDER — ASPIRIN 81 MG PO CHEW
324.0000 mg | CHEWABLE_TABLET | Freq: Once | ORAL | Status: AC
  Administered 2016-09-12: 324 mg via ORAL
  Filled 2016-09-12: qty 4

## 2016-09-12 MED ORDER — ASPIRIN EC 81 MG PO TBEC
81.0000 mg | DELAYED_RELEASE_TABLET | Freq: Every day | ORAL | Status: DC
  Administered 2016-09-13: 81 mg via ORAL
  Filled 2016-09-12: qty 1

## 2016-09-12 NOTE — ED Triage Notes (Signed)
Per ems neighbor called for increased confused x a few weeks. Pt alert and oriented x 3. Pt states he has noticed over the last few weeks he feels confused at times. Pt has an indwelling catheter, bilateral leg amp. Lives at home by himself with services through [facility].
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
## 2016-09-13 LAB — BASIC METABOLIC PANEL
ANION GAP: 8 (ref 5–15)
BUN: 56 mg/dL — ABNORMAL HIGH (ref 6–20)
CALCIUM: 9.1 mg/dL (ref 8.9–10.3)
CO2: 25 mmol/L (ref 22–32)
Chloride: 106 mmol/L (ref 101–111)
Creatinine, Ser: 1.19 mg/dL (ref 0.61–1.24)
GFR, EST NON AFRICAN AMERICAN: 56 mL/min — AB (ref >60)
Glucose, Bld: 106 mg/dL — ABNORMAL HIGH (ref 65–99)
Potassium: 4.3 mmol/L (ref 3.5–5.1)
Sodium: 139 mmol/L (ref 135–145)

## 2016-09-13 LAB — GLUCOSE, CAPILLARY
Glucose-Capillary: 96 mg/dL (ref 65–99)
Glucose-Capillary: 126 mg/dL — ABNORMAL HIGH (ref 65–99)

## 2016-09-13 LAB — TROPONIN I
TROPONIN I: 0.07 ng/mL — AB (ref <0.03)
TROPONIN I: 0.07 ng/mL — AB (ref <0.03)

## 2016-09-13 MED ORDER — BACLOFEN 10 MG PO TABS: 10.0000 mg | ORAL_TABLET | Freq: Every day | ORAL | 0 refills | Status: AC | PRN

## 2016-09-13 MED ORDER — BACLOFEN 10 MG PO TABS
10.0000 mg | ORAL_TABLET | Freq: Once | ORAL | Status: AC
  Administered 2016-09-13: 10 mg via ORAL
  Filled 2016-09-13: qty 1

## 2016-09-13 MED ORDER — FUROSEMIDE 40 MG PO TABS
40.0000 mg | ORAL_TABLET | Freq: Every day | ORAL | 0 refills | Status: AC | PRN
Start: 2016-09-13 — End: ?

## 2016-09-13 MED ORDER — BACLOFEN 10 MG PO TABS
10.0000 mg | ORAL_TABLET | Freq: Every day | ORAL | Status: DC | PRN
  Filled 2016-09-13: qty 1

## 2016-09-13 NOTE — Care Management (Signed)
Patient placed in observation. bilateral amputee with chronic suprapubic catheter CM consult for home health needs.  [facility] by [facility].  he has in home aides and physical therapy .   Agency will transport. Discussed neighbor concerns regarding patient confusion with [facility] representative.

## 2016-09-13 NOTE — Progress Notes (Signed)
Pt is discharge in a stable alert and oriented x 4, summary and f/u care given ,verbalized understanding  transported by [facility] ,
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
## 2017-01-04 ENCOUNTER — Emergency Department: Payer: Medicare (Managed Care)

## 2017-01-04 ENCOUNTER — Inpatient Hospital Stay
Admission: EM | Admit: 2017-01-04 | Discharge: 2017-01-07 | DRG: 853 | Disposition: A | Discharge status: Skilled Nursing Facility | Payer: Medicare (Managed Care) | Attending: Internal Medicine | Admitting: Internal Medicine

## 2017-01-04 ENCOUNTER — Other Ambulatory Visit: Payer: Self-pay

## 2017-01-04 ENCOUNTER — Encounter: Payer: Self-pay | Admitting: Emergency Medicine

## 2017-01-04 DIAGNOSIS — Z955 Presence of coronary angioplasty implant and graft: Secondary | ICD-10-CM | POA: Diagnosis not present

## 2017-01-04 DIAGNOSIS — E1122 Type 2 diabetes mellitus with diabetic chronic kidney disease: Secondary | ICD-10-CM | POA: Diagnosis present

## 2017-01-04 DIAGNOSIS — R195 Other fecal abnormalities: Secondary | ICD-10-CM

## 2017-01-04 DIAGNOSIS — D473 Essential (hemorrhagic) thrombocythemia: Secondary | ICD-10-CM | POA: Diagnosis present

## 2017-01-04 DIAGNOSIS — N179 Acute kidney failure, unspecified: Secondary | ICD-10-CM | POA: Diagnosis present

## 2017-01-04 DIAGNOSIS — T148XXA Other injury of unspecified body region, initial encounter: Secondary | ICD-10-CM

## 2017-01-04 DIAGNOSIS — L089 Local infection of the skin and subcutaneous tissue, unspecified: Secondary | ICD-10-CM | POA: Diagnosis not present

## 2017-01-04 DIAGNOSIS — Z66 Do not resuscitate: Secondary | ICD-10-CM | POA: Diagnosis present

## 2017-01-04 DIAGNOSIS — G9341 Metabolic encephalopathy: Secondary | ICD-10-CM | POA: Diagnosis present

## 2017-01-04 DIAGNOSIS — M86151 Other acute osteomyelitis, right femur: Secondary | ICD-10-CM | POA: Diagnosis present

## 2017-01-04 DIAGNOSIS — Z7982 Long term (current) use of aspirin: Secondary | ICD-10-CM | POA: Diagnosis not present

## 2017-01-04 DIAGNOSIS — D649 Anemia, unspecified: Secondary | ICD-10-CM

## 2017-01-04 DIAGNOSIS — L89314 Pressure ulcer of right buttock, stage 4: Secondary | ICD-10-CM | POA: Diagnosis present

## 2017-01-04 DIAGNOSIS — Z7984 Long term (current) use of oral hypoglycemic drugs: Secondary | ICD-10-CM | POA: Diagnosis not present

## 2017-01-04 DIAGNOSIS — M86651 Other chronic osteomyelitis, right thigh: Secondary | ICD-10-CM | POA: Diagnosis present

## 2017-01-04 DIAGNOSIS — Z89612 Acquired absence of left leg above knee: Secondary | ICD-10-CM

## 2017-01-04 DIAGNOSIS — E1151 Type 2 diabetes mellitus with diabetic peripheral angiopathy without gangrene: Secondary | ICD-10-CM | POA: Diagnosis present

## 2017-01-04 DIAGNOSIS — Z89611 Acquired absence of right leg above knee: Secondary | ICD-10-CM

## 2017-01-04 DIAGNOSIS — E1169 Type 2 diabetes mellitus with other specified complication: Secondary | ICD-10-CM | POA: Diagnosis present

## 2017-01-04 DIAGNOSIS — W3400XS Accidental discharge from unspecified firearms or gun, sequela: Secondary | ICD-10-CM | POA: Diagnosis not present

## 2017-01-04 DIAGNOSIS — A419 Sepsis, unspecified organism: Principal | ICD-10-CM

## 2017-01-04 DIAGNOSIS — Z8673 Personal history of transient ischemic attack (TIA), and cerebral infarction without residual deficits: Secondary | ICD-10-CM

## 2017-01-04 DIAGNOSIS — Z79899 Other long term (current) drug therapy: Secondary | ICD-10-CM

## 2017-01-04 DIAGNOSIS — N319 Neuromuscular dysfunction of bladder, unspecified: Secondary | ICD-10-CM | POA: Diagnosis present

## 2017-01-04 DIAGNOSIS — Z950 Presence of cardiac pacemaker: Secondary | ICD-10-CM

## 2017-01-04 DIAGNOSIS — I2510 Atherosclerotic heart disease of native coronary artery without angina pectoris: Secondary | ICD-10-CM | POA: Diagnosis present

## 2017-01-04 DIAGNOSIS — I4891 Unspecified atrial fibrillation: Secondary | ICD-10-CM | POA: Diagnosis present

## 2017-01-04 DIAGNOSIS — B9562 Methicillin resistant Staphylococcus aureus infection as the cause of diseases classified elsewhere: Secondary | ICD-10-CM | POA: Diagnosis present

## 2017-01-04 DIAGNOSIS — G8220 Paraplegia, unspecified: Secondary | ICD-10-CM | POA: Diagnosis present

## 2017-01-04 DIAGNOSIS — N182 Chronic kidney disease, stage 2 (mild): Secondary | ICD-10-CM | POA: Diagnosis present

## 2017-01-04 DIAGNOSIS — L89214 Pressure ulcer of right hip, stage 4: Secondary | ICD-10-CM | POA: Diagnosis not present

## 2017-01-04 DIAGNOSIS — N289 Disorder of kidney and ureter, unspecified: Secondary | ICD-10-CM

## 2017-01-04 DIAGNOSIS — D638 Anemia in other chronic diseases classified elsewhere: Secondary | ICD-10-CM | POA: Diagnosis not present

## 2017-01-04 DIAGNOSIS — I248 Other forms of acute ischemic heart disease: Secondary | ICD-10-CM | POA: Diagnosis present

## 2017-01-04 DIAGNOSIS — Z87891 Personal history of nicotine dependence: Secondary | ICD-10-CM

## 2017-01-04 DIAGNOSIS — T148XXS Other injury of unspecified body region, sequela: Secondary | ICD-10-CM

## 2017-01-04 HISTORY — DX: Disorder of kidney and ureter, unspecified: N28.9

## 2017-01-04 LAB — COMPREHENSIVE METABOLIC PANEL
ALBUMIN: 2.4 g/dL — AB (ref 3.5–5.0)
ALT: 27 U/L (ref 17–63)
ANION GAP: 13 (ref 5–15)
AST: 37 U/L (ref 15–41)
Alkaline Phosphatase: 183 U/L — ABNORMAL HIGH (ref 38–126)
BILIRUBIN TOTAL: 1.1 mg/dL (ref 0.3–1.2)
BUN: 49 mg/dL — AB (ref 6–20)
CHLORIDE: 97 mmol/L — AB (ref 101–111)
CO2: 22 mmol/L (ref 22–32)
Calcium: 8.3 mg/dL — ABNORMAL LOW (ref 8.9–10.3)
Creatinine, Ser: 1.9 mg/dL — ABNORMAL HIGH (ref 0.61–1.24)
GFR calc Af Amer: 37 mL/min — ABNORMAL LOW (ref >60)
GFR calc non Af Amer: 32 mL/min — ABNORMAL LOW (ref >60)
GLUCOSE: 123 mg/dL — AB (ref 65–99)
POTASSIUM: 4 mmol/L (ref 3.5–5.1)
Sodium: 132 mmol/L — ABNORMAL LOW (ref 135–145)
TOTAL PROTEIN: 7 g/dL (ref 6.5–8.1)

## 2017-01-04 LAB — LACTIC ACID, PLASMA: LACTIC ACID, VENOUS: 1.4 mmol/L (ref 0.5–1.9)

## 2017-01-04 LAB — BLOOD GAS, VENOUS
ACID-BASE DEFICIT: 0.9 mmol/L (ref 0.0–2.0)
Bicarbonate: 24.3 mmol/L (ref 20.0–28.0)
O2 SAT: 82.9 %
PATIENT TEMPERATURE: 37
PO2 VEN: 49 mmHg — AB (ref 32.0–45.0)
pCO2, Ven: 42 mmHg — ABNORMAL LOW (ref 44.0–60.0)
pH, Ven: 7.37 (ref 7.250–7.430)

## 2017-01-04 LAB — CBC WITH DIFFERENTIAL/PLATELET
BASOS ABS: 0 10*3/uL (ref 0–0.1)
BASOS PCT: 0 %
Eosinophils Absolute: 0 10*3/uL (ref 0–0.7)
Eosinophils Relative: 0 %
HEMATOCRIT: 22.8 % — AB (ref 40.0–52.0)
HEMOGLOBIN: 7.2 g/dL — AB (ref 13.0–18.0)
LYMPHS PCT: 4 %
Lymphs Abs: 0.9 10*3/uL — ABNORMAL LOW (ref 1.0–3.6)
MCH: 26.5 pg (ref 26.0–34.0)
MCHC: 31.5 g/dL — ABNORMAL LOW (ref 32.0–36.0)
MCV: 84 fL (ref 80.0–100.0)
Monocytes Absolute: 1 10*3/uL (ref 0.2–1.0)
Monocytes Relative: 4 %
NEUTROS ABS: 20.2 10*3/uL — AB (ref 1.4–6.5)
NEUTROS PCT: 92 %
Platelets: 435 10*3/uL (ref 150–440)
RBC: 2.71 MIL/uL — ABNORMAL LOW (ref 4.40–5.90)
RDW: 18.4 % — ABNORMAL HIGH (ref 11.5–14.5)
WBC: 22.1 10*3/uL — ABNORMAL HIGH (ref 3.8–10.6)

## 2017-01-04 LAB — GLUCOSE, CAPILLARY
GLUCOSE-CAPILLARY: 144 mg/dL — AB (ref 65–99)
Glucose-Capillary: 129 mg/dL — ABNORMAL HIGH (ref 65–99)

## 2017-01-04 LAB — IRON AND TIBC: TIBC: 191 ug/dL — ABNORMAL LOW (ref 250–450)

## 2017-01-04 LAB — VITAMIN B12: Vitamin B-12: 666 pg/mL (ref 180–914)

## 2017-01-04 LAB — TROPONIN I
TROPONIN I: 0.04 ng/mL — AB (ref <0.03)
Troponin I: 0.07 ng/mL (ref <0.03)

## 2017-01-04 LAB — URINALYSIS, COMPLETE (UACMP) WITH MICROSCOPIC
BACTERIA UA: NONE SEEN
Bilirubin Urine: NEGATIVE
GLUCOSE, UA: NEGATIVE mg/dL
KETONES UR: NEGATIVE mg/dL
NITRITE: NEGATIVE
Protein, ur: 100 mg/dL — AB
Specific Gravity, Urine: 1.014 (ref 1.005–1.030)
pH: 5 (ref 5.0–8.0)

## 2017-01-04 LAB — RETICULOCYTES
RBC.: 2.69 MIL/uL — ABNORMAL LOW (ref 4.40–5.90)
Retic Count, Absolute: 26.9 10*3/uL (ref 19.0–183.0)
Retic Ct Pct: 1 % (ref 0.4–3.1)

## 2017-01-04 LAB — MRSA PCR SCREENING: MRSA by PCR: POSITIVE — AB

## 2017-01-04 LAB — FERRITIN: Ferritin: 90 ng/mL (ref 24–336)

## 2017-01-04 LAB — FOLATE: FOLATE: 17.9 ng/mL (ref >5.9)

## 2017-01-04 LAB — PREPARE RBC (CROSSMATCH)

## 2017-01-04 MED ORDER — ACETAMINOPHEN 650 MG RE SUPP: 650.0000 mg | Freq: Four times a day (QID) | RECTAL | Status: DC | PRN

## 2017-01-04 MED ORDER — CHLORHEXIDINE GLUCONATE CLOTH 2 % EX PADS
6.0000 | MEDICATED_PAD | Freq: Every day | CUTANEOUS | Status: DC
  Administered 2017-01-05 – 2017-01-07 (×3): 6 via TOPICAL

## 2017-01-04 MED ORDER — SODIUM CHLORIDE 0.9 % IV SOLN
INTRAVENOUS | Status: DC
  Administered 2017-01-04 – 2017-01-07 (×6): via INTRAVENOUS

## 2017-01-04 MED ORDER — ALLOPURINOL 100 MG PO TABS
200.0000 mg | ORAL_TABLET | Freq: Every day | ORAL | Status: DC
  Administered 2017-01-04 – 2017-01-07 (×4): 200 mg via ORAL
  Filled 2017-01-04 (×4): qty 2

## 2017-01-04 MED ORDER — DARIFENACIN HYDROBROMIDE ER 7.5 MG PO TB24
7.5000 mg | ORAL_TABLET | Freq: Every day | ORAL | Status: DC
Start: 2017-01-04 — End: 2017-01-07
  Administered 2017-01-05 – 2017-01-06 (×2): 7.5 mg via ORAL
  Filled 2017-01-04 (×5): qty 1

## 2017-01-04 MED ORDER — FERROUS SULFATE 325 (65 FE) MG PO TABS
325.0000 mg | ORAL_TABLET | ORAL | Status: DC
  Administered 2017-01-05 – 2017-01-07 (×2): 325 mg via ORAL
  Filled 2017-01-04 (×2): qty 1

## 2017-01-04 MED ORDER — DAKINS (1/4 STRENGTH) 0.125 % EX SOLN
Freq: Two times a day (BID) | CUTANEOUS | Status: DC
  Administered 2017-01-04 (×2)
  Filled 2017-01-04: qty 473

## 2017-01-04 MED ORDER — POLYETHYLENE GLYCOL 3350 17 G PO PACK: 17.0000 g | PACK | Freq: Every day | ORAL | Status: DC | PRN

## 2017-01-04 MED ORDER — BENEPROTEIN PO POWD
6.0000 g | Freq: Three times a day (TID) | ORAL | Status: DC
  Filled 2017-01-04 (×2): qty 227

## 2017-01-04 MED ORDER — COLLAGENASE 250 UNIT/GM EX OINT
TOPICAL_OINTMENT | Freq: Every day | CUTANEOUS | Status: DC
  Administered 2017-01-04: 17:00:00 via TOPICAL
  Filled 2017-01-04: qty 30

## 2017-01-04 MED ORDER — SODIUM CHLORIDE 0.9 % IV SOLN
Freq: Once | INTRAVENOUS | Status: AC
  Administered 2017-01-04: 16:00:00 via INTRAVENOUS

## 2017-01-04 MED ORDER — HYDROCODONE-ACETAMINOPHEN 5-325 MG PO TABS
1.0000 | ORAL_TABLET | ORAL | Status: DC | PRN
  Administered 2017-01-06: 1 via ORAL
  Administered 2017-01-07: 2 via ORAL
  Filled 2017-01-04: qty 2
  Filled 2017-01-04: qty 1

## 2017-01-04 MED ORDER — ACETAMINOPHEN 325 MG PO TABS: 650.0000 mg | ORAL_TABLET | Freq: Four times a day (QID) | ORAL | Status: DC | PRN

## 2017-01-04 MED ORDER — PIPERACILLIN-TAZOBACTAM 3.375 G IVPB
3.3750 g | Freq: Two times a day (BID) | INTRAVENOUS | Status: DC
  Administered 2017-01-04 – 2017-01-05 (×2): 3.375 g via INTRAVENOUS
  Filled 2017-01-04 (×2): qty 50

## 2017-01-04 MED ORDER — SODIUM CHLORIDE 0.9 % IV SOLN
Freq: Once | INTRAVENOUS | Status: AC
  Administered 2017-01-04: 11:00:00 via INTRAVENOUS

## 2017-01-04 MED ORDER — BACLOFEN 10 MG PO TABS
20.0000 mg | ORAL_TABLET | Freq: Three times a day (TID) | ORAL | Status: DC
  Administered 2017-01-04 – 2017-01-07 (×8): 20 mg via ORAL
  Filled 2017-01-04 (×2): qty 2
  Filled 2017-01-04: qty 1
  Filled 2017-01-04 (×4): qty 2
  Filled 2017-01-04: qty 1
  Filled 2017-01-04 (×3): qty 2

## 2017-01-04 MED ORDER — ZINC OXIDE 40 % EX OINT
TOPICAL_OINTMENT | Freq: Every morning | CUTANEOUS | Status: DC
  Administered 2017-01-04: 17:00:00 via TOPICAL
  Administered 2017-01-06: 1 via TOPICAL
  Filled 2017-01-04: qty 114

## 2017-01-04 MED ORDER — PANTOPRAZOLE SODIUM 40 MG PO TBEC: 40.0000 mg | DELAYED_RELEASE_TABLET | Freq: Two times a day (BID) | ORAL | Status: DC

## 2017-01-04 MED ORDER — OSELTAMIVIR PHOSPHATE 30 MG PO CAPS
30.0000 mg | ORAL_CAPSULE | ORAL | Status: AC
  Administered 2017-01-04 – 2017-01-06 (×2): 30 mg via ORAL
  Filled 2017-01-04 (×2): qty 1

## 2017-01-04 MED ORDER — HALOPERIDOL LACTATE 5 MG/ML IJ SOLN
2.0000 mg | Freq: Once | INTRAMUSCULAR | Status: AC
  Administered 2017-01-04: 2 mg via INTRAVENOUS
  Filled 2017-01-04 (×2): qty 0.4

## 2017-01-04 MED ORDER — INSULIN ASPART 100 UNIT/ML ~~LOC~~ SOLN
0.0000 [IU] | Freq: Three times a day (TID) | SUBCUTANEOUS | Status: DC
  Administered 2017-01-04: 1 [IU] via SUBCUTANEOUS
  Administered 2017-01-05: 2 [IU] via SUBCUTANEOUS
  Administered 2017-01-06 (×2): 1 [IU] via SUBCUTANEOUS
  Administered 2017-01-06: 2 [IU] via SUBCUTANEOUS
  Filled 2017-01-04 (×5): qty 1

## 2017-01-04 MED ORDER — MUPIROCIN 2 % EX OINT
1.0000 "application " | TOPICAL_OINTMENT | Freq: Two times a day (BID) | CUTANEOUS | Status: DC
  Administered 2017-01-05 – 2017-01-06 (×4): 1 via NASAL
  Filled 2017-01-04: qty 22

## 2017-01-04 MED ORDER — SODIUM CHLORIDE 0.9 % IV SOLN
Freq: Once | INTRAVENOUS | Status: AC
  Administered 2017-01-04: 10:00:00 via INTRAVENOUS

## 2017-01-04 MED ORDER — PIPERACILLIN-TAZOBACTAM 3.375 G IVPB 30 MIN
3.3750 g | Freq: Once | INTRAVENOUS | Status: AC
  Administered 2017-01-04: 3.375 g via INTRAVENOUS
  Filled 2017-01-04: qty 50

## 2017-01-04 MED ORDER — ZINC OXIDE 12.8 % EX OINT
1.0000 "application " | TOPICAL_OINTMENT | Freq: Every day | CUTANEOUS | Status: DC
  Filled 2017-01-04: qty 56.7

## 2017-01-04 MED ORDER — CITALOPRAM HYDROBROMIDE 20 MG PO TABS
20.0000 mg | ORAL_TABLET | Freq: Every day | ORAL | Status: DC
  Administered 2017-01-04 – 2017-01-07 (×4): 20 mg via ORAL
  Filled 2017-01-04 (×4): qty 1

## 2017-01-04 MED ORDER — GABAPENTIN 600 MG PO TABS
600.0000 mg | ORAL_TABLET | Freq: Every day | ORAL | Status: DC
  Administered 2017-01-05 – 2017-01-06 (×2): 600 mg via ORAL
  Filled 2017-01-04 (×3): qty 1

## 2017-01-04 MED ORDER — ZINC SULFATE 220 (50 ZN) MG PO CAPS
220.0000 mg | ORAL_CAPSULE | Freq: Every day | ORAL | Status: DC
  Administered 2017-01-04 – 2017-01-05 (×2): 220 mg via ORAL
  Filled 2017-01-04 (×2): qty 1

## 2017-01-04 MED ORDER — ENOXAPARIN SODIUM 40 MG/0.4ML ~~LOC~~ SOLN: 40.0000 mg | SUBCUTANEOUS | Status: DC

## 2017-01-04 MED ORDER — ONDANSETRON HCL 4 MG/2ML IJ SOLN
4.0000 mg | Freq: Four times a day (QID) | INTRAMUSCULAR | Status: DC | PRN
  Administered 2017-01-05: 4 mg via INTRAVENOUS

## 2017-01-04 MED ORDER — ATORVASTATIN CALCIUM 20 MG PO TABS
40.0000 mg | ORAL_TABLET | Freq: Every day | ORAL | Status: DC
  Administered 2017-01-05 – 2017-01-06 (×2): 40 mg via ORAL
  Filled 2017-01-04 (×3): qty 2

## 2017-01-04 MED ORDER — PANTOPRAZOLE SODIUM 40 MG IV SOLR
40.0000 mg | Freq: Two times a day (BID) | INTRAVENOUS | Status: DC
  Administered 2017-01-04 – 2017-01-07 (×6): 40 mg via INTRAVENOUS
  Filled 2017-01-04 (×6): qty 40

## 2017-01-04 MED ORDER — VANCOMYCIN HCL IN DEXTROSE 1-5 GM/200ML-% IV SOLN
1000.0000 mg | Freq: Once | INTRAVENOUS | Status: AC
  Administered 2017-01-04: 1000 mg via INTRAVENOUS
  Filled 2017-01-04: qty 200

## 2017-01-04 MED ORDER — ONDANSETRON HCL 4 MG PO TABS: 4.0000 mg | ORAL_TABLET | Freq: Four times a day (QID) | ORAL | Status: DC | PRN

## 2017-01-04 MED ORDER — ASPIRIN EC 81 MG PO TBEC
81.0000 mg | DELAYED_RELEASE_TABLET | Freq: Every day | ORAL | Status: DC
  Administered 2017-01-05 – 2017-01-07 (×3): 81 mg via ORAL
  Filled 2017-01-04 (×4): qty 1

## 2017-01-04 MED ORDER — VITAMIN C 500 MG PO TABS
500.0000 mg | ORAL_TABLET | ORAL | Status: DC
  Administered 2017-01-05 – 2017-01-07 (×2): 500 mg via ORAL
  Filled 2017-01-04 (×2): qty 1

## 2017-01-04 MED ORDER — BISACODYL 5 MG PO TBEC: 5.0000 mg | DELAYED_RELEASE_TABLET | Freq: Every day | ORAL | Status: DC | PRN
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
## 2017-01-04 NOTE — ED Triage Notes (Signed)
Patient presents to ED via [facility] from [facility]. PACE nurse saw patient today and was concerned about a wound to patients right buttock. Nurse at [facility] gave IM rocephin. Staff at nursing facility also reported that patient was combative last night. Patient arrives calm and cooperative. Wound assessed by this RN and Dr. Jimmye Norman. Wound noted to have old packing and purulent drainage. Wound contaminated with stool as well. Wet to dry dress change completed by this RN on arrival. Sterile technique maintained. One kerlex (3" x 75") wet with sterile saline used to pack wound. Wound then dressed with one 5" x 9" sterile dressing and dated. Patient tolerated well. Patient denies any at this time.

## 2017-01-04 NOTE — Progress Notes (Signed)
Patient having intermittent confusion and auditory hallucination. Patient became combative while trying to assess wound and administer medications. He then calmed down for a while, he took some of his medications po and then stated " I'm not gone keep taking all this stuff y'all trying to kill me".
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
## 2017-01-04 NOTE — Care Management (Signed)
Patient is from [facility] under PACE payer [phone]. He has history history of paraplegia, PVD status post bilateral AKA per medical record.  CSW updated.  Trudy at [facility] states that he is only there under respite care.

## 2017-01-04 NOTE — Consult Note (Signed)
Pharmacy Antibiotic Note  Roger Winters is a 81 y.o. male admitted on 01/04/2017 with sepsis and wound infection.  Pharmacy has been consulted for vancomycin and zosyn dosing.  Plan: Patient has received 1g of vancomycin and 1 dose of zosyn in the ED.  Patient is a paraplegic and s/p bilateral AKA. Therefore accurate Crcl difficult to estimate. Patient is currently in acute renal failure. Baseline scr appears to be 1.19, today it is 1.9. Will need to dose based off of levels for now Will draw random level with tomorrow AM labs Pt received about 15.3mg /kg dose Zosyn 3.375g q 12 hr  Height: 3\' 10"  (116.8 cm)(unable to assess; pt b/l AKA) Weight: 144 lb (65.3 kg) IBW/kg (Calculated) : 17.8  Temp (24hrs), Avg:97.8 F (36.6 C), Min:97.4 F (36.3 C), Max:98.1 F (36.7 C)  Recent Labs  Lab 01/04/17 0918  WBC 22.1*  CREATININE 1.90*  LATICACIDVEN 1.4    Estimated Creatinine Clearance: 16.4 mL/min (A) (by C-G formula based on SCr of 1.9 mg/dL (H)).    No Known Allergies  Antimicrobials this admission: vancomycin 11/6 >>  zosyn 11/6 >>   Dose adjustments this admission:   Microbiology results: 11/6 BCx:  11/6 UCx:   11/6 MRSA PCR:  11/6 wound:  Thank you for allowing pharmacy to be a part of this patient's care.  Ramond Dial, Pharm.D, BCPS Clinical Pharmacist  01/04/2017 4:06 PM

## 2017-01-04 NOTE — Progress Notes (Signed)
Inpatient Diabetes Program Recommendations  AACE/ADA: New Consensus Statement on Inpatient Glycemic Control (2015)  Target Ranges:  Prepandial:   less than 140 mg/dL      [facility] postprandial:   less than 180 mg/dL (1-2 hours)      Critically ill patients:  140 - 180 mg/dL   Review of Glycemic Control  Diabetes history: DM 2 Outpatient Diabetes medications: Metformin 500 mg Daily Current orders for Inpatient glycemic control: Novolog Sensitive Correction 0-9 units tid  Inpatient Diabetes Program Recommendations:    Right Buttocks wound. Admitting glucose 123 mg/dl. Will follow trends while inpatient.  Thanks,  Tama Headings RN, MSN, [facility] Inpatient Diabetes Coordinator Team Pager [phone] (8a-5p)

## 2017-01-04 NOTE — ED Notes (Signed)
Per [place] [phone], Patient is in the PACE program and has been at [facility] resources for treatment of a Stage IV decub on right buttock. OVer the last week, the patient has exhibited auditory and visual hallucination and has been combative which is outside the norm for him, Patient had a negative flu swab last week and was a negative UA on 10/31. Patient was given IM Rocephin yesterday.

## 2017-01-04 NOTE — Consult Note (Signed)
[place] Nurse wound consult note Reason for Consult: Unstageable right ischial tuberosity pressure injury.  Wound bed is 100% obscured with necrotic tissue (black eschar) Wound type:Pressure Pressure Injury POA: Yes Measurement: 5cm x 3.5cm with depth obscured by the presence of black, nonviable tissue. Suspect this will be a full thickness, Stage 3 or 4 pressure injury Wound bed:As described above Drainage (amount, consistency, odor) small amount of dark brown exudate on old dressing consistent with the color of necrotic tissue.  Malodorous, but consistent with necrotic tissue Periwound: Intact, dry.  Previous healing (scar) noted at sacrum, indicative of previous full thickness tissue injury related to pressure. Dressing procedure/placement/frequency: I have provided guidance for Nursing via the Orders for three times daily application of collagenase ointment with sodium hypochlorite (Dakin's solution) dressings for 3 days to reduce bioburden and improve odor.  Following that, we can decrease the frequency to once daily with collagenase and NS dressings to facilitate enzymatic debridement of necrotic tissue.  If you desire a more rapid and clean  Debridement than enzymatic can provide, recommendation is to consult with Surgery for that service. Nutritional support is recommended.  If you agree, please order Dietary Consult. As this is a "sitting" ulcer and not one from lying in a supine position, Nursing is encouraged to keep patient's HOB at or below a 30 degree angle except for meals. A mattress replacement with low air loss feature is not needed at this time.  Pressure redistribution when sitting up in a chair or wheelchair is needed. [place] nursing team will not follow, but will remain available to this patient, the nursing and medical teams.  Please re-consult if needed. Thanks, Maudie Flakes, MSN, RN, [place], Arther Abbott  Pager# [phone]

## 2017-01-05 ENCOUNTER — Inpatient Hospital Stay: Payer: Medicare (Managed Care)

## 2017-01-05 ENCOUNTER — Inpatient Hospital Stay: Payer: Medicare (Managed Care) | Admitting: Registered Nurse

## 2017-01-05 ENCOUNTER — Encounter
Admission: EM | Disposition: A | Discharge status: Skilled Nursing Facility | Payer: Self-pay | Source: Home / Self Care | Attending: Internal Medicine

## 2017-01-05 DIAGNOSIS — L89214 Pressure ulcer of right hip, stage 4: Secondary | ICD-10-CM

## 2017-01-05 HISTORY — PX: DEBRIDMENT OF DECUBITUS ULCER: SHX6276

## 2017-01-05 LAB — GLUCOSE, CAPILLARY
GLUCOSE-CAPILLARY: 219 mg/dL — AB (ref 65–99)
Glucose-Capillary: 112 mg/dL — ABNORMAL HIGH (ref 65–99)
Glucose-Capillary: 120 mg/dL — ABNORMAL HIGH (ref 65–99)
Glucose-Capillary: 189 mg/dL — ABNORMAL HIGH (ref 65–99)

## 2017-01-05 LAB — SEDIMENTATION RATE: Sed Rate: >140 mm/hr — ABNORMAL HIGH (ref 0–20)

## 2017-01-05 LAB — BPAM RBC
Blood Product Expiration Date: 201811142359
ISSUE DATE / TIME: 201811061529
UNIT TYPE AND RH: 5100

## 2017-01-05 LAB — TYPE AND SCREEN
ABO/RH(D): O POS
Antibody Screen: NEGATIVE
UNIT DIVISION: 0

## 2017-01-05 LAB — C-REACTIVE PROTEIN: CRP: 14.2 mg/dL — ABNORMAL HIGH (ref <1.0)

## 2017-01-05 LAB — CBC
HCT: 24.8 % — ABNORMAL LOW (ref 40.0–52.0)
Hemoglobin: 7.9 g/dL — ABNORMAL LOW (ref 13.0–18.0)
MCH: 26.8 pg (ref 26.0–34.0)
MCHC: 32 g/dL (ref 32.0–36.0)
MCV: 83.8 fL (ref 80.0–100.0)
PLATELETS: 405 10*3/uL (ref 150–440)
RBC: 2.96 MIL/uL — AB (ref 4.40–5.90)
RDW: 17.6 % — AB (ref 11.5–14.5)
WBC: 15.5 10*3/uL — AB (ref 3.8–10.6)

## 2017-01-05 LAB — HEMOGLOBIN AND HEMATOCRIT, BLOOD
HEMATOCRIT: 24.6 % — AB (ref 40.0–52.0)
HEMOGLOBIN: 7.7 g/dL — AB (ref 13.0–18.0)

## 2017-01-05 LAB — BASIC METABOLIC PANEL
Anion gap: 9 (ref 5–15)
BUN: 42 mg/dL — AB (ref 6–20)
CALCIUM: 8 mg/dL — AB (ref 8.9–10.3)
CHLORIDE: 103 mmol/L (ref 101–111)
CO2: 21 mmol/L — ABNORMAL LOW (ref 22–32)
CREATININE: 1.35 mg/dL — AB (ref 0.61–1.24)
GFR, EST AFRICAN AMERICAN: 56 mL/min — AB (ref >60)
GFR, EST NON AFRICAN AMERICAN: 48 mL/min — AB (ref >60)
Glucose, Bld: 109 mg/dL — ABNORMAL HIGH (ref 65–99)
Potassium: 3.6 mmol/L (ref 3.5–5.1)
SODIUM: 133 mmol/L — AB (ref 135–145)

## 2017-01-05 LAB — VITAMIN B12: Vitamin B-12: 901 pg/mL (ref 180–914)

## 2017-01-05 LAB — VANCOMYCIN, RANDOM: Vancomycin Rm: 8

## 2017-01-05 LAB — TROPONIN I
TROPONIN I: 0.08 ng/mL — AB (ref <0.03)
TROPONIN I: 0.15 ng/mL — AB (ref <0.03)

## 2017-01-05 SURGERY — DEBRIDMENT OF DECUBITUS ULCER
Anesthesia: General | Laterality: Right

## 2017-01-05 MED ORDER — FENTANYL CITRATE (PF) 100 MCG/2ML IJ SOLN
INTRAMUSCULAR | Status: AC
  Filled 2017-01-05: qty 2

## 2017-01-05 MED ORDER — SILVER SULFADIAZINE 1 % EX CREA
TOPICAL_CREAM | CUTANEOUS | Status: AC
  Filled 2017-01-05: qty 85

## 2017-01-05 MED ORDER — OCUVITE-LUTEIN PO CAPS
1.0000 | ORAL_CAPSULE | Freq: Every day | ORAL | Status: DC
  Administered 2017-01-05 – 2017-01-07 (×3): 1 via ORAL
  Filled 2017-01-05 (×3): qty 1

## 2017-01-05 MED ORDER — ROCURONIUM BROMIDE 100 MG/10ML IV SOLN
INTRAVENOUS | Status: DC | PRN
  Administered 2017-01-05: 40 mg via INTRAVENOUS
  Administered 2017-01-05: 10 mg via INTRAVENOUS

## 2017-01-05 MED ORDER — MIDAZOLAM HCL 2 MG/2ML IJ SOLN
INTRAMUSCULAR | Status: DC | PRN
  Administered 2017-01-05: 2 mg via INTRAVENOUS

## 2017-01-05 MED ORDER — SUGAMMADEX SODIUM 200 MG/2ML IV SOLN
INTRAVENOUS | Status: DC | PRN
  Administered 2017-01-05: 140.6 mg via INTRAVENOUS

## 2017-01-05 MED ORDER — DEXAMETHASONE SODIUM PHOSPHATE 10 MG/ML IJ SOLN
INTRAMUSCULAR | Status: AC
  Filled 2017-01-05: qty 1

## 2017-01-05 MED ORDER — PROPOFOL 10 MG/ML IV BOLUS
INTRAVENOUS | Status: DC | PRN
  Administered 2017-01-05: 60 mg via INTRAVENOUS

## 2017-01-05 MED ORDER — DEXAMETHASONE SODIUM PHOSPHATE 10 MG/ML IJ SOLN
INTRAMUSCULAR | Status: DC | PRN
  Administered 2017-01-05: 10 mg via INTRAVENOUS

## 2017-01-05 MED ORDER — FENTANYL CITRATE (PF) 100 MCG/2ML IJ SOLN
INTRAMUSCULAR | Status: DC | PRN
  Administered 2017-01-05: 50 ug via INTRAVENOUS

## 2017-01-05 MED ORDER — HALOPERIDOL LACTATE 5 MG/ML IJ SOLN
2.0000 mg | Freq: Once | INTRAMUSCULAR | Status: AC
  Administered 2017-01-05: 2 mg via INTRAVENOUS
  Filled 2017-01-05: qty 0.4

## 2017-01-05 MED ORDER — LIDOCAINE HCL (CARDIAC) 20 MG/ML IV SOLN
INTRAVENOUS | Status: DC | PRN
  Administered 2017-01-05: 100 mg via INTRAVENOUS

## 2017-01-05 MED ORDER — SUGAMMADEX SODIUM 200 MG/2ML IV SOLN
INTRAVENOUS | Status: AC
  Filled 2017-01-05: qty 2

## 2017-01-05 MED ORDER — PROPOFOL 10 MG/ML IV BOLUS
INTRAVENOUS | Status: AC
  Filled 2017-01-05: qty 20

## 2017-01-05 MED ORDER — MIDAZOLAM HCL 2 MG/2ML IJ SOLN
INTRAMUSCULAR | Status: AC
  Filled 2017-01-05: qty 2

## 2017-01-05 MED ORDER — IOPAMIDOL (ISOVUE-300) INJECTION 61%
100.0000 mL | Freq: Once | INTRAVENOUS | Status: AC | PRN
  Administered 2017-01-05: 100 mL via INTRAVENOUS

## 2017-01-05 MED ORDER — ONDANSETRON HCL 4 MG/2ML IJ SOLN
INTRAMUSCULAR | Status: AC
Start: 2017-01-05 — End: ?
  Filled 2017-01-05: qty 2

## 2017-01-05 MED ORDER — PIPERACILLIN-TAZOBACTAM 3.375 G IVPB
3.3750 g | Freq: Three times a day (TID) | INTRAVENOUS | Status: DC
  Administered 2017-01-05 – 2017-01-07 (×5): 3.375 g via INTRAVENOUS
  Filled 2017-01-05 (×5): qty 50

## 2017-01-05 MED ORDER — SUCCINYLCHOLINE CHLORIDE 20 MG/ML IJ SOLN
INTRAMUSCULAR | Status: DC | PRN
  Administered 2017-01-05: 100 mg via INTRAVENOUS

## 2017-01-05 MED ORDER — PREMIER PROTEIN SHAKE
11.0000 [oz_av] | Freq: Two times a day (BID) | ORAL | Status: DC
  Administered 2017-01-05 – 2017-01-06 (×3): 11 [oz_av] via ORAL

## 2017-01-05 MED ORDER — VANCOMYCIN HCL IN DEXTROSE 1-5 GM/200ML-% IV SOLN
1000.0000 mg | INTRAVENOUS | Status: DC
  Administered 2017-01-05 – 2017-01-06 (×2): 1000 mg via INTRAVENOUS
  Filled 2017-01-05 (×3): qty 200

## 2017-01-05 SURGICAL SUPPLY — 22 items
BLADE SURG 15 STRL LF DISP TIS (BLADE) ×1 IMPLANT
BLADE SURG 15 STRL SS (BLADE) ×2
BNDG GAUZE 4.5X4.1 6PLY STRL (MISCELLANEOUS) ×6 IMPLANT
CHLORAPREP W/TINT 26ML (MISCELLANEOUS) ×3 IMPLANT
DRAPE LAPAROTOMY 100X77 ABD (DRAPES) ×3 IMPLANT
DRAPE UTILITY 15X26 TOWEL STRL (DRAPES) ×3 IMPLANT
ELECT CAUTERY BLADE 6.4 (BLADE) ×3 IMPLANT
ELECT REM PT RETURN 9FT ADLT (ELECTROSURGICAL) ×3
ELECTRODE REM PT RTRN 9FT ADLT (ELECTROSURGICAL) ×1 IMPLANT
GAUZE SPONGE 4X4 12PLY STRL (GAUZE/BANDAGES/DRESSINGS) ×3 IMPLANT
GLOVE BIO SURGEON STRL SZ8 (GLOVE) ×3 IMPLANT
GOWN STRL REUS W/ TWL LRG LVL3 (GOWN DISPOSABLE) ×2 IMPLANT
GOWN STRL REUS W/TWL LRG LVL3 (GOWN DISPOSABLE) ×4
KIT RM TURNOVER STRD PROC AR (KITS) ×3 IMPLANT
LABEL OR SOLS (LABEL) ×3 IMPLANT
NEEDLE HYPO 25X1 1.5 SAFETY (NEEDLE) ×3 IMPLANT
NS IRRIG 500ML POUR BTL (IV SOLUTION) ×3 IMPLANT
PACK BASIN MINOR ARMC (MISCELLANEOUS) ×3 IMPLANT
PAD ABD DERMACEA PRESS 5X9 (GAUZE/BANDAGES/DRESSINGS) ×6 IMPLANT
SPONGE LAP 18X18 5 PK (GAUZE/BANDAGES/DRESSINGS) ×3 IMPLANT
SYR BULB EAR ULCER 3OZ GRN STR (SYRINGE) ×3 IMPLANT
SYRINGE 10CC LL (SYRINGE) ×3 IMPLANT

## 2017-01-05 NOTE — Progress Notes (Signed)
Notified Dr. Marcille Blanco of abnormal Troponin of 0.15. No new orders given at this time.

## 2017-01-05 NOTE — Op Note (Signed)
01/05/2017  11:35 AM  PATIENT:  Roger Winters  81 y.o. male  PRE-OPERATIVE DIAGNOSIS: Right ischial decubitus ulcer  POST-OPERATIVE DIAGNOSIS: Same  PROCEDURE: Examination under anesthesia debridement of right ischial decubitus ulcer  SURGEON:  Florene Glen MD, FACS  ANESTHESIA:   General with endotracheal tube   Details of Procedure: This patient with a right ischial decubitus ulcer which is foul-smelling and requires debridement.  Preoperatively discussed rationale for surgery the options of observation and the risk of bleeding infection recurrence and an open wound and the potential for a wound VAC placement was reviewed as well.  Findings: Deep right ischial decubitus ulcer that extended to the ischial tuberosity and probably involves the tuberosity with a high likelihood of osteomyelitis.  Patient was discharged general anesthesia and then placed into a well-padded beanbag left lateral recumbent position.  Surgical timeout was held and then he was prepped and draped in a sterile fashion inspection of the wound demonstrated no erythema and no invasive component to an infection but there was considerable necrotic tissue.  This necrotic tissue was excised sharply.  Scissors 10 blade knife and electrocautery was utilized to excise dead tissue back to bleeding tissue.  This rather deep wound tract to the ischial tuberosity.  The ischium itself was exposed with dead tissue likely representing osteomyelitis.  Rough debridement followed with the use of a laparotomy pad.  This was done multiple times and bleeding was controlled with minimal use of electrocautery.  A Silvadene soaked Kerlix gauze was then packed into the wound and an ABD pad was placed.  Patient taught this procedure well there were no complications he was taken to recovery room in stable condition to be admitted for continued care.  The idea of placing a wound VAC was discussed in the operating room and considered.   Because of the location of this wound close to his anus as well as his scrotum precluded this due to the high risk of the wound VAC not sealing properly.  Made blood loss was minimal.  Sponge lap needle counts correct.   Florene Glen, MD FACS
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
## 2017-01-05 NOTE — NC FL2 (Signed)
[place] LEVEL OF CARE SCREENING TOOL     IDENTIFICATION  Patient Name: Roger Winters Birthdate: 12/31/1935 Sex: male Admission Date (Current Location): 01/04/2017  [place] and [place] Number:  Engineering geologist and Address:  [facility], [address], [place], [place] [address]      Provider Number: [id number]  Attending Physician Name and Address:  Max Sane, MD  Relative Name and Phone Number:       Current Level of Care: Hospital Recommended Level of Care: [place] Prior Approval Number:    Date Approved/Denied:   PASRR Number:    Discharge Plan: SNF    Current Diagnoses: Patient Active Problem List   Diagnosis Date Noted  . Sepsis ([place]) 01/04/2017  . Pressure injury of right hip, stage 4 ([place])   . Altered mental status 09/12/2016  . GI bleed 12/26/2014    Orientation RESPIRATION BLADDER Height & Weight     Self  Normal Incontinent Weight: 155 lb (70.3 kg) Height:  3\' 10"  (116.8 cm)(unable to assess; pt b/l AKA)  BEHAVIORAL SYMPTOMS/MOOD NEUROLOGICAL BOWEL NUTRITION STATUS  (none) (none) Continent Diet(regular)  AMBULATORY STATUS COMMUNICATION OF NEEDS Skin   Total Care   PU Stage and Appropriate Care(wound vac)                       Personal Care Assistance Level of Assistance  Total care       Total Care Assistance: Maximum assistance   Functional Limitations Info             SPECIAL CARE FACTORS FREQUENCY                       Contractures Contractures Info: Not present    Additional Factors Info  Code Status, Allergies Code Status Info: dnr Allergies Info: nka           Current Medications (01/05/2017):  This is the current hospital active medication list Current Facility-Administered Medications  Medication Dose Route Frequency Provider Last Rate Last Dose  . 0.9 %  sodium chloride infusion   Intravenous Continuous Bettey Costa, MD 75 mL/hr at 01/05/17  1231    . acetaminophen (TYLENOL) tablet 650 mg  650 mg Oral Q6H PRN Bettey Costa, MD       Or  . acetaminophen (TYLENOL) suppository 650 mg  650 mg Rectal Q6H PRN Mody, Sital, MD      . allopurinol (ZYLOPRIM) tablet 200 mg  200 mg Oral Daily Mody, Sital, MD   200 mg at 01/04/17 1637  . aspirin EC tablet 81 mg  81 mg Oral Daily Mody, Sital, MD      . atorvastatin (LIPITOR) tablet 40 mg  40 mg Oral QPC supper Mody, Sital, MD      . baclofen (LIORESAL) tablet 20 mg  20 mg Oral TID Bettey Costa, MD   20 mg at 01/04/17 1516  . bisacodyl (DULCOLAX) EC tablet 5 mg  5 mg Oral Daily PRN Bettey Costa, MD      . Chlorhexidine Gluconate Cloth 2 % PADS 6 each  6 each Topical Q0600 Bettey Costa, MD   6 each at 01/05/17 0630  . citalopram (CELEXA) tablet 20 mg  20 mg Oral Daily Bettey Costa, MD   20 mg at 01/04/17 1658  . darifenacin (ENABLEX) 24 hr tablet 7.5 mg  7.5 mg Oral Q2000 Bettey Costa, MD      .  ferrous sulfate tablet 325 mg  325 mg Oral Q M,W,F Mody, Sital, MD      . gabapentin (NEURONTIN) tablet 600 mg  600 mg Oral QHS Mody, Sital, MD      . HYDROcodone-acetaminophen (NORCO/VICODIN) 5-325 MG per tablet 1-2 tablet  1-2 tablet Oral Q4H PRN Mody, Sital, MD      . insulin aspart (novoLOG) injection 0-9 Units  0-9 Units Subcutaneous TID WC Bettey Costa, MD   1 Units at 01/04/17 1725  . liver oil-zinc oxide (DESITIN) 40 % ointment   Topical q morning - 10a Mody, Sital, MD      . mupirocin ointment (BACTROBAN) 2 % 1 application  1 application Nasal BID Mody, Sital, MD      . ondansetron (ZOFRAN) tablet 4 mg  4 mg Oral Q6H PRN Mody, Sital, MD       Or  . ondansetron (ZOFRAN) injection 4 mg  4 mg Intravenous Q6H PRN Bettey Costa, MD   4 mg at 01/05/17 1058  . oseltamivir (TAMIFLU) capsule 30 mg  30 mg Oral Adalberto Ill, Sital, MD   30 mg at 01/04/17 1525  . pantoprazole (PROTONIX) injection 40 mg  40 mg Intravenous Q12H Bettey Costa, MD   40 mg at 01/04/17 2124  . piperacillin-tazobactam (ZOSYN) IVPB 3.375 g  3.375  g Intravenous Q12H Ramond Dial, RPH   Stopped at 01/05/17 1227  . polyethylene glycol (MIRALAX / GLYCOLAX) packet 17 g  17 g Oral Daily PRN Mody, Sital, MD      . protein supplement (RESOURCE BENEPROTEIN) powder 6 g  6 g Oral TID WC Mody, Sital, MD      . vitamin C (ASCORBIC ACID) tablet 500 mg  500 mg Oral Q M,W,F Mody, Sital, MD      . zinc sulfate capsule 220 mg  220 mg Oral Daily Bettey Costa, MD   220 mg at 01/04/17 1525     Discharge Medications: Please see discharge summary for a list of discharge medications.  Relevant Imaging Results:  Relevant Lab Results:   Additional Information    Shela Leff, LCSW
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
## 2017-01-05 NOTE — Transfer of Care (Signed)
Immediate Anesthesia Transfer of Care Note  Patient: Roger Winters  Procedure(s) Performed: DEBRIDMENT OF ISCHIAL DECUBITUS ULCER (Right )  Patient Location: PACU  Anesthesia Type:General  Level of Consciousness: sedated  Airway & Oxygen Therapy: Patient Spontanous Breathing and Patient connected to face mask oxygen  Post-op Assessment: Report given to RN and Post -op Vital signs reviewed and stable  Post vital signs: Reviewed and stable  Last Vitals:  Vitals:   01/05/17 0527 01/05/17 1010  BP: (!) 161/85 (!) 146/76  Pulse: 76 61  Resp: 20 18  Temp: 37.6 C (!) 35.9 C  SpO2: 98% 100%    Last Pain:  Vitals:   01/05/17 1010  TempSrc: Tympanic  PainSc: 0-No pain         Complications: No apparent anesthesia complications

## 2017-01-05 NOTE — Progress Notes (Signed)
Preoperative Review   Patient is met in the preoperative holding area. The history is reviewed in the chart and with the patient. I personally reviewed the options and rationale as well as the risks of this procedure that have been previously discussed with the patient. All questions asked by the patient and/or family were answered to their satisfaction.  Patient agrees to proceed with this procedure at this time.  Richard E Cooper M.D. FACS  

## 2017-01-05 NOTE — Anesthesia Postprocedure Evaluation (Signed)
Anesthesia Post Note  Patient: Roger Winters  Procedure(s) Performed: DEBRIDMENT OF ISCHIAL DECUBITUS ULCER (Right )  Patient location during evaluation: PACU Anesthesia Type: General Level of consciousness: awake and alert Pain management: pain level controlled Vital Signs Assessment: post-procedure vital signs reviewed and stable Respiratory status: spontaneous breathing, nonlabored ventilation and respiratory function stable Cardiovascular status: blood pressure returned to baseline and stable Postop Assessment: no signs of nausea or vomiting Anesthetic complications: no     Last Vitals:  Vitals:   01/05/17 1206 01/05/17 1214  BP:  103/66  Pulse:  68  Resp:    Temp: 36.4 C   SpO2:  96%    Last Pain:  Vitals:   01/05/17 1214  TempSrc:   PainSc: 0-No pain                 Clevester Helzer

## 2017-01-05 NOTE — Progress Notes (Signed)
Patient agitated, combative and aggressive. Spoke to Dr. Marcille Blanco and got an order for Haldol. Will continue to monitor patient.

## 2017-01-05 NOTE — Anesthesia Post-op Follow-up Note (Signed)
Anesthesia QCDR form completed.        

## 2017-01-05 NOTE — Progress Notes (Signed)
Patient combative, confused, verbally abusive, refusing meds, refusing lab work and refusing turning. Called and spoke to Dr. Jannifer Franklin and got an order for Haldol IV.  Will continue to monitor patient.

## 2017-01-05 NOTE — Consult Note (Signed)
Pharmacy Antibiotic Note  Roger Winters is a 81 y.o. male admitted on 01/04/2017 with sepsis and wound infection. Now confirmed as osteomyelitis.   Pharmacy has been consulted for vancomycin and zosyn dosing.  Plan: Patient has received 1g of vancomycin and 1 dose of zosyn in the ED.  Patient is a paraplegic and s/p bilateral AKA. Therefore accurate Crcl difficult to estimate.  Estimated Kinetics: IBW:48kg   Ke: 0.029   Vd:49  T1/2: 23.9  DW: 70  Will start vancomycin 1g IV every 24 hours. Calculated trough at Css is 20. Trough level ordered before 4th dose. Will monitor renal function and adjust dose as needed.   Adjust dose to Zosyn 3.375g IV EI q 8 hr  Height: 5\' 2"  (157.5 cm) Weight: 155 lb (70.3 kg) IBW/kg (Calculated) : 54.6  Temp (24hrs), Avg:97.8 F (36.6 C), Min:96.6 F (35.9 C), Max:99.7 F (37.6 C)  Recent Labs  Lab 01/04/17 0918 01/05/17 0605  WBC 22.1* 15.5*  CREATININE 1.90* 1.35*  LATICACIDVEN 1.4  --   VANCORANDOM  --  8    Estimated Creatinine Clearance: 38.2 mL/min (A) (by C-G formula based on SCr of 1.35 mg/dL (H)).    No Known Allergies  Antimicrobials this admission: vancomycin 11/6 >>  zosyn 11/6 >>   Dose adjustments this admission:   Microbiology results: 11/6 BCx:  11/6 UCx:   11/6 MRSA PCR:  11/6 wound:  Thank you for allowing pharmacy to be a part of this patient's care.  Pernell Dupre, PharmD, BCPS Clinical Pharmacist 01/05/2017 4:41 PM
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
## 2017-01-05 NOTE — Clinical Social Work Note (Signed)
Clinical Social Work Assessment  Patient Details  Name: Roger Winters MRN: [id number] Date of Birth: [birth year]  Date of referral:  01/05/17               Reason for consult:  Discharge Planning                Permission sought to share information with:    Permission granted to share information::     Name::        Agency::     Relationship::     Contact Information:     Housing/Transportation Living arrangements for the past 2 months:  [place] of Information:  Friend/Neighbor Patient Interpreter Needed:  None Criminal Activity/Legal Involvement Pertinent to Current Situation/Hospitalization:  No - Comment as needed Significant Relationships:  Friend Lives with:  Facility Resident Do you feel safe going back to the place where you live?    Need for family participation in patient care:     Care giving concerns:  Patient resides at [facility]   Social Worker assessment / plan:  CSW informed by [facility] that patient is at [facility] and is expected to return. Patient's main contact is a friend: Bessie who assists patient. She stated that the plan is for patient to return to [facility] as directed by [facility]. [facility] may transport when time, but patient may benefit from an EMS ride due to wound.   Employment status:  Retired Forensic scientist:    PT Recommendations:    Information / Referral to community resources:     Patient/Family's Response to care:  Patient's friend expressed appreciation for CSW assistance.  Patient/Family's Understanding of and Emotional Response to Diagnosis, Current Treatment, and Prognosis:  Patient's friend is involved in patient's care.  Emotional Assessment Appearance:  Appears stated age Attitude/Demeanor/Rapport:  (pleasant and quiet) Affect (typically observed):    Orientation:    Alcohol / Substance use:  Not Applicable Psych involvement (Current and /or in the community):  No (Comment)  Discharge Needs  Concerns to  be addressed:  No discharge needs identified Readmission within the last 30 days:  No Current discharge risk:  None Barriers to Discharge:  No Barriers Identified   Shela Leff, LCSW 01/05/2017, 1:10 PM

## 2017-01-05 NOTE — Progress Notes (Signed)
CC: Right decubitus ulcer Subjective: This patient with a right decubitus ulcer needing debridement.  It is foul-smelling.  Does not appear to be actively infected however.  I met and discussed Roger Winters care with his physician who believes that his confusion is secondary to high-dose baclofen.  She also states that he was signing his own checks and caring for himself until 3 weeks ago when the baclofen orders were changed.  She believes that he can sign his own consent.  We discussed the rationale for debridement and wound VAC placement and that outpatient wound VAC could be handled at his facility.  Objective: Vital signs in last 24 hours: Temp:  [97.2 F (36.2 C)-99.7 F (37.6 C)] 99.7 F (37.6 C) (11/07 0527) Pulse Rate:  [59-80] 76 (11/07 0527) Resp:  [11-22] 20 (11/07 0527) BP: (112-161)/(55-85) 161/85 (11/07 0527) SpO2:  [96 %-100 %] 98 % (11/07 0527) Weight:  [144 lb (65.3 kg)-155 lb 4.8 oz (70.4 kg)] 155 lb 4.8 oz (70.4 kg) (11/07 0500)    Intake/Output from previous day: 11/06 0701 - 11/07 0700 In: 2723.4 [P.O.:120; I.V.:2000; Blood:353.4; IV Piggyback:250] Out: 950 [Urine:950] Intake/Output this shift: No intake/output data recorded.  Physical exam:  Awake and alert Vital signs stable and reviewed Bilateral AKA Lab Results: CBC  Recent Labs    01/04/17 0918 01/04/17 2332 01/05/17 0605  WBC 22.1*  --  15.5*  HGB 7.2* 7.7* 7.9*  HCT 22.8* 24.6* 24.8*  PLT 435  --  405   BMET Recent Labs    01/04/17 0918 01/05/17 0605  NA 132* 133*  K 4.0 3.6  CL 97* 103  CO2 22 21*  GLUCOSE 123* 109*  BUN 49* 42*  CREATININE 1.90* 1.35*  CALCIUM 8.3* 8.0*   PT/INR No results for input(s): LABPROT, INR in the last 72 hours. ABG Recent Labs    01/04/17 0852  HCO3 24.3    Studies/Results: Dg Chest Port 1 View  Result Date: 01/04/2017 CLINICAL DATA:  Fever in patient with a wound on the right buttock. EXAM: PORTABLE CHEST 1 VIEW COMPARISON:  PA and lateral  chest 09/03/2015. FINDINGS: There is marked cardiomegaly. Lungs are clear. Aortic atherosclerosis is seen. Pacing device is in place. Severe degenerative disease about the shoulders is worse on the right. IMPRESSION: Cardiomegaly without acute disease. Electronically Signed   By: Inge Rise M.D.   On: 01/04/2017 10:49    Anti-infectives: Anti-infectives (From admission, onward)   Start     Dose/Rate Route Frequency Ordered Stop   01/04/17 2130  piperacillin-tazobactam (ZOSYN) IVPB 3.375 g     3.375 g 12.5 mL/hr over 240 Minutes Intravenous Every 12 hours 01/04/17 1526     01/04/17 1500  oseltamivir (TAMIFLU) capsule 30 mg     30 mg Oral Every other day 01/04/17 1323 01/08/17 0959   01/04/17 0845  piperacillin-tazobactam (ZOSYN) IVPB 3.375 g     3.375 g 100 mL/hr over 30 Minutes Intravenous  Once 01/04/17 0841 01/04/17 1006   01/04/17 0845  vancomycin (VANCOCIN) IVPB 1000 mg/200 mL premix     1,000 mg 200 mL/hr over 60 Minutes Intravenous  Once 01/04/17 0841 01/04/17 1036      Assessment/Plan:  White count remains elevated.  Recommend debridement and wound VAC placement of right ischial decubitus rationale for this was discussed with the patient and his outpatient physician who was present during the discussion we discussed the rationale and the risks of bleeding infection nonhealing and wound VAC placement all questions were  answered.  Patient agrees.  He is n.p.o. and will be scheduled today  Florene Glen, MD, FACS  01/05/2017

## 2017-01-05 NOTE — Anesthesia Procedure Notes (Signed)
Procedure Name: Intubation Date/Time: 01/05/2017 11:06 AM Performed by: Nelda Marseille, CRNA Pre-anesthesia Checklist: Patient identified, Patient being monitored, Timeout performed, Emergency Drugs available and Suction available Patient Re-evaluated:Patient Re-evaluated prior to induction Oxygen Delivery Method: Circle system utilized Preoxygenation: Pre-oxygenation with 100% oxygen Induction Type: IV induction Ventilation: Mask ventilation without difficulty Laryngoscope Size: Mac and 3 Grade View: Grade I Tube type: Oral Tube size: 7.5 mm Number of attempts: 1 Airway Equipment and Method: Stylet Placement Confirmation: ETT inserted through vocal cords under direct vision,  positive ETCO2 and breath sounds checked- equal and bilateral Secured at: 21 cm Tube secured with: Tape Dental Injury: Teeth and Oropharynx as per pre-operative assessment

## 2017-01-06 ENCOUNTER — Inpatient Hospital Stay: Payer: Medicare (Managed Care)

## 2017-01-06 ENCOUNTER — Encounter: Payer: Self-pay | Admitting: Surgery

## 2017-01-06 LAB — GLUCOSE, CAPILLARY
GLUCOSE-CAPILLARY: 164 mg/dL — AB (ref 65–99)
GLUCOSE-CAPILLARY: 136 mg/dL — AB (ref 65–99)
GLUCOSE-CAPILLARY: 168 mg/dL — AB (ref 65–99)
Glucose-Capillary: 132 mg/dL — ABNORMAL HIGH (ref 65–99)

## 2017-01-06 LAB — CREATININE, SERUM: Creatinine, Ser: 1.1 mg/dL (ref 0.61–1.24)

## 2017-01-06 LAB — SURGICAL PATHOLOGY

## 2017-01-06 MED ORDER — SILVER SULFADIAZINE 1 % EX CREA
TOPICAL_CREAM | Freq: Two times a day (BID) | CUTANEOUS | Status: DC
  Administered 2017-01-06: 1 via TOPICAL
  Administered 2017-01-06: 23:00:00 via TOPICAL
  Filled 2017-01-06: qty 85

## 2017-01-06 MED ORDER — SODIUM CHLORIDE 0.9% FLUSH
10.0000 mL | INTRAVENOUS | Status: DC | PRN
Start: 2017-01-06 — End: 2017-01-07

## 2017-01-06 NOTE — Progress Notes (Signed)
Inpatient Diabetes Program Recommendations  AACE/ADA: New Consensus Statement on Inpatient Glycemic Control (2015)  Target Ranges:  Prepandial:   less than 140 mg/dL      [facility] postprandial:   less than 180 mg/dL (1-2 hours)      Critically ill patients:  140 - 180 mg/dL   Lab Results  Component Value Date   GLUCAP 132 (H) 01/06/2017     Review of Glycemic Control  Results for IMAN, OROURKE (MRN [id number]) as of 01/06/2017 11:10  Ref. Range 01/05/2017 07:43 01/05/2017 12:50 01/05/2017 16:40 01/05/2017 21:00 01/06/2017 08:01  Glucose-Capillary Latest Ref Range: 65 - 99 mg/dL 112 (H) 120 (H) 189 (H) 219 (H) 132 (H)    Diabetes history: DM 2 Outpatient Diabetes medications: Metformin 500 mg Daily  Current orders for Inpatient glycemic control: Novolog Sensitive Correction 0-9 units tid  Inpatient Diabetes Program Recommendations:  Agree with current medications for blood sugar management.   Gentry Fitz, RN, BA, MHA, CDE Diabetes Coordinator Inpatient Diabetes Program  [phone] (Team Pager) [phone] ([place]) 01/06/2017 11:11 AM

## 2017-01-06 NOTE — Progress Notes (Signed)
Peripherally Inserted Central Catheter/Midline Placement  The IV Nurse has discussed with the patient and/or persons authorized to consent for the patient, the purpose of this procedure and the potential benefits and risks involved with this procedure.  The benefits include less needle sticks, lab draws from the catheter, and the patient may be discharged home with the catheter. Risks include, but not limited to, infection, bleeding, blood clot (thrombus formation), and puncture of an artery; nerve damage and irregular heartbeat and possibility to perform a PICC exchange if needed/ordered by physician.  Alternatives to this procedure were also discussed.  Bard Power PICC patient education guide, fact sheet on infection prevention and patient information card has been provided to patient /or left at bedside.    PICC/Midline Placement Documentation  PICC Single Lumen 92/92/44 PICC Right Basilic 41 cm 0 cm (Active)  Indication for Insertion or Continuance of Line Prolonged intravenous therapies 01/06/2017  1:00 PM  Exposed Catheter (cm) 0 cm 01/06/2017  1:00 PM  Site Assessment Clean;Dry;Intact 01/06/2017  1:00 PM  Line Status Flushed;Saline locked;Blood return noted 01/06/2017  1:00 PM  Dressing Type Transparent 01/06/2017  1:00 PM  Dressing Status Clean;Intact;Dry;Antimicrobial disc in place 01/06/2017  1:00 PM  Line Care Connections checked and tightened 01/06/2017  1:00 PM  Dressing Intervention New dressing 01/06/2017  1:00 PM  Dressing Change Due 01/13/17 01/06/2017  1:00 PM    Telephone consent w/ Eugenia Mcalpine 01/06/2017, 1:38 PM

## 2017-01-06 NOTE — Progress Notes (Signed)
[facility] [place] NAME: Roger Winters    MR#:  [id number]  DATE OF BIRTH:  1935/11/26  SUBJECTIVE:  CHIEF COMPLAINT:   Chief Complaint  Patient presents with  . Code Sepsis  Sleepy. No new issues, S/P I and D 11/7 REVIEW OF SYSTEMS:  Review of Systems  Constitutional: Negative for chills, fever and weight loss.  HENT: Negative for nosebleeds and sore throat.   Eyes: Negative for blurred vision.  Respiratory: Negative for cough, shortness of breath and wheezing.   Cardiovascular: Negative for chest pain, orthopnea, leg swelling and PND.  Gastrointestinal: Negative for abdominal pain, constipation, diarrhea, heartburn, nausea and vomiting.  Genitourinary: Negative for dysuria and urgency.  Musculoskeletal: Positive for joint pain. Negative for back pain.  Skin: Negative for rash.  Neurological: Negative for dizziness, speech change, focal weakness and headaches.  Endo/Heme/Allergies: Does not bruise/bleed easily.  Psychiatric/Behavioral: Negative for depression.   DRUG ALLERGIES:  No Known Allergies VITALS:  Blood pressure 136/81, pulse 68, temperature 97.6 F (36.4 C), temperature source Oral, resp. rate 16, height 5\' 2"  (1.575 m), weight 72.6 kg (160 lb), SpO2 99 %. PHYSICAL EXAMINATION:  Physical Exam  Constitutional: He is oriented to person, place, and time and well-developed, well-nourished, and in no distress.  HENT:  Head: Normocephalic and atraumatic.  Eyes: Conjunctivae and EOM are normal. Pupils are equal, round, and reactive to light.  Neck: Normal range of motion. Neck supple. No tracheal deviation present. No thyromegaly present.  Cardiovascular: Normal rate, regular rhythm and normal heart sounds.  Pulmonary/Chest: Effort normal and breath sounds normal. No respiratory distress. He has no wheezes. He exhibits no tenderness.  Abdominal: Soft. Bowel sounds are normal. He exhibits no distension. There is no  tenderness.  Musculoskeletal: Normal range of motion.  Neurological: He is alert and oriented to person, place, and time. No cranial nerve deficit.  Skin: Skin is warm and dry. No rash noted.  Right heel in dressing  Psychiatric: Mood and affect normal.   LABORATORY PANEL:  Male CBC Recent Labs  Lab 01/05/17 0605  WBC 15.5*  HGB 7.9*  HCT 24.8*  PLT 405   ------------------------------------------------------------------------------------------------------------------ Chemistries  Recent Labs  Lab 01/04/17 0918 01/05/17 0605 01/06/17 0918  NA 132* 133*  --   K 4.0 3.6  --   CL 97* 103  --   CO2 22 21*  --   GLUCOSE 123* 109*  --   BUN 49* 42*  --   CREATININE 1.90* 1.35* 1.10  CALCIUM 8.3* 8.0*  --   AST 37  --   --   ALT 27  --   --   ALKPHOS 183*  --   --   BILITOT 1.1  --   --    RADIOLOGY:  No results found. ASSESSMENT AND PLAN:  81 year old male from [facility] with history of a sacral wound who presents with worsening confusion and fevers and found to have purulent drainage from sacral wound.  1. Acute encephalopathy in the setting of sacral wound on top of underlying confusion -mental status back to baseline  2. Right ischial decubitus ulcer with underlying osteomyelitis  - s/p debridement 11/7 -Continue IV Zosyn. -Appreciate ID input, s/p PICC line - await wound c/s for final Abx   3. AKI on CKD stage 2; hold nephrotoxic medications and continue IV fluids -Resolved with hydration  4. AOCD in the setting of wound  infection - GI input appreciated, no luminal eval planned - Transfused 1 unit -hemoglobin 7.9 - Protonix BID - Continue ferrous sulfate  5. Diabetes: Continue SSI  Hold Metformin DM nurse following  6. Recent diagnosis of influenza as per medical chart on Tamiflu for total of 10 days started on 10/20 QOD.  7. History of atrial fibrillation:  currently paced rhythm  8. Elevated troponin: due to demand  ischemia      All the records are reviewed and case discussed with Care Management/Social Worker. Management plans discussed with the patient, nursing and they are in agreement.  CODE STATUS: DNR  TOTAL TIME TAKING CARE OF THIS PATIENT: 35 minutes.   More than 50% of the time was spent in counseling/coordination of care: YES  POSSIBLE D/C IN 1-2 DAYS, DEPENDING ON CLINICAL CONDITION. And wound c/s   Max Sane M.D on 01/06/2017 at 2:13 PM  Between 7am to 6pm - Pager - [phone]  After 6pm go to [email or web address] - Proofreader  [facility]  Office  [phone]  CC: Primary care physician; Gareth Morgan, MD  Note: This dictation was prepared with Dragon dictation along with smaller phrase technology. Any transcriptional errors that result from this process are unintentional.

## 2017-01-07 LAB — BASIC METABOLIC PANEL
ANION GAP: 6 (ref 5–15)
BUN: 34 mg/dL — ABNORMAL HIGH (ref 6–20)
CO2: 22 mmol/L (ref 22–32)
Calcium: 8.3 mg/dL — ABNORMAL LOW (ref 8.9–10.3)
Chloride: 108 mmol/L (ref 101–111)
Creatinine, Ser: 1.33 mg/dL — ABNORMAL HIGH (ref 0.61–1.24)
GFR calc non Af Amer: 49 mL/min — ABNORMAL LOW (ref >60)
GFR, EST AFRICAN AMERICAN: 57 mL/min — AB (ref >60)
GLUCOSE: 123 mg/dL — AB (ref 65–99)
POTASSIUM: 4.6 mmol/L (ref 3.5–5.1)
Sodium: 136 mmol/L (ref 135–145)

## 2017-01-07 LAB — GLUCOSE, CAPILLARY: Glucose-Capillary: 119 mg/dL — ABNORMAL HIGH (ref 65–99)

## 2017-01-07 LAB — CBC
HCT: 23.9 % — ABNORMAL LOW (ref 40.0–52.0)
HEMOGLOBIN: 7.5 g/dL — AB (ref 13.0–18.0)
MCH: 26.5 pg (ref 26.0–34.0)
MCHC: 31.5 g/dL — AB (ref 32.0–36.0)
MCV: 84.3 fL (ref 80.0–100.0)
Platelets: 395 10*3/uL (ref 150–440)
RBC: 2.84 MIL/uL — ABNORMAL LOW (ref 4.40–5.90)
RDW: 17.8 % — AB (ref 11.5–14.5)
WBC: 11.7 10*3/uL — ABNORMAL HIGH (ref 3.8–10.6)

## 2017-01-07 MED ORDER — VANCOMYCIN HCL IN DEXTROSE 1-5 GM/200ML-% IV SOLN: 1000.0000 mg | INTRAVENOUS | 0 refills | Status: AC

## 2017-01-07 MED ORDER — SILVER SULFADIAZINE 1 % EX CREA: TOPICAL_CREAM | Freq: Two times a day (BID) | CUTANEOUS | 0 refills | Status: DC

## 2017-01-07 MED ORDER — ALBUMIN HUMAN 25 % IV SOLN
INTRAVENOUS | Status: AC
  Filled 2017-01-07: qty 100

## 2017-01-07 MED ORDER — PIPERACILLIN-TAZOBACTAM 3.375 G IVPB: 3.3750 g | Freq: Three times a day (TID) | INTRAVENOUS | 0 refills | Status: AC

## 2017-01-07 NOTE — Progress Notes (Signed)
Report called to Joy at  [facility]/ verbalized an understnading/ tele removed/ [facility] to transport to SNF

## 2017-01-07 NOTE — Progress Notes (Signed)
Patient awake and alert this morning.  Vital signs are stable and afebrile  Wound is dressed.  Not inspected  Patient doing well at this point.  No further surgical needs.  Will sign off.

## 2017-01-07 NOTE — Progress Notes (Signed)
Pt refusing care at this time/ attempted to assess pt and obtain VS and FSBS/ states " no, not right now"/ Dr. Posey Pronto made aware/ will continue to attempt to provide care for pt.

## 2017-01-07 NOTE — Consult Note (Addendum)
[facility] Nurse wound consult note Reason for Consult: Recently debrided wound to right ischium.  Application of VAC therapy.   Wound type:Stage 4 pressure injury, s/p debridement Pressure Injury POA: Yes Measurement: 6 cm x 4.2 cm x 4 cm with 10% adherent gray slough to undermining from 4 to 7 o'clock.  Wound ASN:KNLZ pink nongranulating tissue with visible tendon and muscle to wound bed.  Drainage (amount, consistency, odor) Minimal gray and serosanguinous drainage with necrotic odor present.  Periwound:intact.  Wound in close proximity to rectum and gluteal fold.  Incontinent of stool.  protect wound with barrier ring to promote seal and bridge to right anterior thigh.  Dressing procedure/placement/frequency: Cleanse wound to right ischial tuberosity with NS.  Gently fill wound depth with black foam, including undermining.  Protect periwound skin with two barrier rings to promote effective seal.  Bridge dressing to right thigh.  Change Monday/Wednesday/Friday.   Patient and "friend" at bedside are educated that he needs to eat well and balance time in chair and bed to offload pressure or wound will not heal.  Albumin is 2.4 currently. Encouraged to eat well and drink nutritional supplements Will not follow at this time. Imminent discharge.  Please re-consult if needed.  Domenic Moras RN BSN [place] Pager [phone]

## 2017-01-07 NOTE — Clinical Social Work Note (Signed)
Patient to discharge to [facility] today and Sharita at [facility] is aware and has made arrangements. She has spoken to patient's responsible party and discharge information has been sent. [facility] to transport. Shela Leff MSW,LCSW [phone]

## 2017-01-07 NOTE — Clinical Social Work Note (Signed)
CSW received a call from [place] with [facility] (after patient was already at [facility]) stating that they had not made arrangements for the wound vac and wanted to know if I could contact my sources to see if a wound vac could be delivered to [facility]. CSW explained that wound vacs are not obtained by CSW for patient's going to the facilities and that the facilities make those arrangements for the wound vac. [facility] was aware of patient's need for a wound vac prior to patient discharging. CSW was informed prior to discharge by both Sherita at [place] at [facility] that everything was ready for patient to come. [facility] provided transportation to [facility].   Sherita stated that she had spoken to her provider and that they would revert back to the previous wound care until they could get the wound vac delivered to [facility] next week. Shela Leff MSW,LCSW [phone]

## 2017-01-07 NOTE — Discharge Instructions (Signed)
Antibiotic Medicine, Adult Antibiotic medicines treat infections caused by a type of germ called bacteria. They work by killing the bacteria that make you sick. When do I need to take antibiotics? You often need these medicines to treat bacterial infections, such as:  A urinary tract infection (UTI).  Strep throat.  Meningitis. This affects the spinal cord and brain.  A bad lung infection.  You may start the medicines while your doctor waits for tests to come back. When the tests come back, your doctor may change or stop your medicine. When are antibiotics not needed? You do not need these medicines for most common illnesses, such as:  A cold.  The flu.  A sore throat.  Antibiotics are not always needed for all infections caused by bacteria. Do not ask for these medicines, or take them, when they are not needed. What are the risks of taking antibiotics? Most antibiotics can cause an infection called Clostridium difficile.This causes watery poop (diarrhea). Let your doctor know right away if:  You have watery poop while taking an antibiotic.  You have watery poop after you stop taking an antibiotic. The illness can happen weeks after you stop the medicine.  You also have a risk of getting an infection in the future that antibiotics cannot treat (antibiotic-resistant infection). This type of infection can be dangerous. What else should I know about taking antibiotics?  You need to take the entire prescription. ? Take the medicine for as long as told by your doctor. ? Do not stop taking it even if you start to feel better.  Try not to miss any doses. If you miss a dose, call your doctor.  Birth control pills may not work. If you take birth control pills: ? Keep on taking them. ? Use a second form of birth control, such as a condom. Do this for as long as told by your doctor.  Ask your doctor: ? How long to wait in between doses. ? If you should take the medicine with  food. ? If there is anything you should stay away from while taking the antibiotic, such as: ? Food. ? Drinks. ? Medicines. ? If there are any side effects you should watch for.  Only take the medicines that your doctor told you to take. Do not take medicines that were given to someone else.  Drink a large glass of water with the medicine.  Ask the pharmacist for a tool to measure the medicine, such as: ? A syringe. ? A cup. ? A spoon.  Throw away any extra medicine. Contact a doctor if:  You get worse.  You have new joint pain or muscle aches after starting the medicine.  You have side effects from the medicine, such as: ? Stomach pain. ? Watery poop. ? Feeling sick to your stomach (nausea). Get help right away if:  You have signs of a very bad allergic reaction. If this happens, stop taking the medicine right away. Signs may include: ? Hives. These are raised, itchy, red bumps on the skin. ? Skin rash. ? Trouble breathing. ? Wheezing. ? Swelling. ? Feeling dizzy. ? Throwing up (vomiting).  Your pee (urine) is dark, or is the color of blood.  Your skin turns yellow.  You bruise easily.  You bleed easily.  You have very bad watery poop and cramps in your belly.  You have a very bad headache. Summary  Antibiotics are often used to treat infections caused by bacteria.  Only take  these medicines when needed.  Let your doctor know if you have watery poop while taking an antibiotic.  You need to take the entire prescription. This information is not intended to replace advice given to you by your health care provider. Make sure you discuss any questions you have with your health care provider. Document Released: 11/25/2007 Document Revised: 02/18/2016 Document Reviewed: 02/18/2016 Elsevier Interactive Patient Education  2017 [place] changes per nursing instructions

## 2017-01-07 NOTE — Progress Notes (Signed)
Infectious Disease Long Term IV Antibiotic Orders Roger Winters February 27, 1936  Diagnosis: infected decub ulcer and osteomyelitis  Culture results Pending  LABS Lab Results  Component Value Date   CREATININE 1.33 (H) 01/07/2017   Lab Results  Component Value Date   WBC 11.7 (H) 01/07/2017   HGB 7.5 (L) 01/07/2017   HCT 23.9 (L) 01/07/2017   MCV 84.3 01/07/2017   PLT 395 01/07/2017   Lab Results  Component Value Date   ESRSEDRATE >140 (H) 01/05/2017   Lab Results  Component Value Date   CRP 14.2 (H) 01/05/2017    Allergies: No Known Allergies  Discharge antibiotics Vancomycin          1000        mg  every    24           hours .     Goal vancomycin trough 15-20.    Pharmacy to adjust dosing based on levels Zosyn  3.375  grams every 8 hours  PICC Care per protocol Labs weekly while on IV antibiotics -FAX weekly labs to [phone] CBC w diff   Comprehensive met panel Vancomycin Trough   CRP   Planned duration of antibiotics 6 weeks   Stop date Dec 19th 2018  Follow up clinic date 2-3 weeks   Leonel Ramsay, MD
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
## 2017-01-09 LAB — CULTURE, BLOOD (ROUTINE X 2)
CULTURE: NO GROWTH
Culture: NO GROWTH
Special Requests: ADEQUATE

## 2017-01-14 LAB — SUSCEPTIBILITY RESULT

## 2017-01-14 LAB — SUSCEPTIBILITY, AER + ANAEROB

## 2017-01-16 LAB — SUSCEPTIBILITY RESULT

## 2017-01-16 LAB — SUSCEPTIBILITY, AER + ANAEROB

## 2017-01-18 LAB — URINE CULTURE

## 2017-01-19 LAB — AEROBIC/ANAEROBIC CULTURE W GRAM STAIN (SURGICAL/DEEP WOUND)

## 2017-01-19 LAB — AEROBIC/ANAEROBIC CULTURE (SURGICAL/DEEP WOUND)

## 2017-01-27 ENCOUNTER — Encounter: Payer: Self-pay | Admitting: *Deleted

## 2017-01-27 ENCOUNTER — Emergency Department: Payer: Medicare (Managed Care)

## 2017-01-27 ENCOUNTER — Inpatient Hospital Stay
Admission: EM | Admit: 2017-01-27 | Discharge: 2017-01-31 | DRG: 871 | Disposition: A | Discharge status: Skilled Nursing Facility | Payer: Medicare (Managed Care) | Attending: Internal Medicine | Admitting: Internal Medicine

## 2017-01-27 ENCOUNTER — Other Ambulatory Visit: Payer: Self-pay

## 2017-01-27 DIAGNOSIS — E1122 Type 2 diabetes mellitus with diabetic chronic kidney disease: Secondary | ICD-10-CM | POA: Diagnosis present

## 2017-01-27 DIAGNOSIS — E1169 Type 2 diabetes mellitus with other specified complication: Secondary | ICD-10-CM | POA: Diagnosis present

## 2017-01-27 DIAGNOSIS — W19XXXA Unspecified fall, initial encounter: Secondary | ICD-10-CM | POA: Diagnosis present

## 2017-01-27 DIAGNOSIS — I4891 Unspecified atrial fibrillation: Secondary | ICD-10-CM | POA: Diagnosis present

## 2017-01-27 DIAGNOSIS — E1151 Type 2 diabetes mellitus with diabetic peripheral angiopathy without gangrene: Secondary | ICD-10-CM | POA: Diagnosis present

## 2017-01-27 DIAGNOSIS — Z87891 Personal history of nicotine dependence: Secondary | ICD-10-CM

## 2017-01-27 DIAGNOSIS — M869 Osteomyelitis, unspecified: Secondary | ICD-10-CM | POA: Diagnosis present

## 2017-01-27 DIAGNOSIS — Z66 Do not resuscitate: Secondary | ICD-10-CM | POA: Diagnosis present

## 2017-01-27 DIAGNOSIS — S065XAA Traumatic subdural hemorrhage with loss of consciousness status unknown, initial encounter: Secondary | ICD-10-CM

## 2017-01-27 DIAGNOSIS — Z955 Presence of coronary angioplasty implant and graft: Secondary | ICD-10-CM

## 2017-01-27 DIAGNOSIS — R7881 Bacteremia: Secondary | ICD-10-CM | POA: Diagnosis not present

## 2017-01-27 DIAGNOSIS — N3000 Acute cystitis without hematuria: Secondary | ICD-10-CM | POA: Diagnosis present

## 2017-01-27 DIAGNOSIS — Z7982 Long term (current) use of aspirin: Secondary | ICD-10-CM

## 2017-01-27 DIAGNOSIS — Z79899 Other long term (current) drug therapy: Secondary | ICD-10-CM

## 2017-01-27 DIAGNOSIS — E7220 Disorder of urea cycle metabolism, unspecified: Secondary | ICD-10-CM | POA: Diagnosis present

## 2017-01-27 DIAGNOSIS — G8220 Paraplegia, unspecified: Secondary | ICD-10-CM | POA: Diagnosis present

## 2017-01-27 DIAGNOSIS — R4182 Altered mental status, unspecified: Secondary | ICD-10-CM | POA: Diagnosis present

## 2017-01-27 DIAGNOSIS — Z89612 Acquired absence of left leg above knee: Secondary | ICD-10-CM

## 2017-01-27 DIAGNOSIS — E876 Hypokalemia: Secondary | ICD-10-CM | POA: Diagnosis present

## 2017-01-27 DIAGNOSIS — S065X9A Traumatic subdural hemorrhage with loss of consciousness of unspecified duration, initial encounter: Secondary | ICD-10-CM | POA: Diagnosis present

## 2017-01-27 DIAGNOSIS — L89154 Pressure ulcer of sacral region, stage 4: Secondary | ICD-10-CM | POA: Diagnosis present

## 2017-01-27 DIAGNOSIS — Z89611 Acquired absence of right leg above knee: Secondary | ICD-10-CM

## 2017-01-27 DIAGNOSIS — R778 Other specified abnormalities of plasma proteins: Secondary | ICD-10-CM

## 2017-01-27 DIAGNOSIS — I2510 Atherosclerotic heart disease of native coronary artery without angina pectoris: Secondary | ICD-10-CM | POA: Diagnosis present

## 2017-01-27 DIAGNOSIS — E785 Hyperlipidemia, unspecified: Secondary | ICD-10-CM | POA: Diagnosis present

## 2017-01-27 DIAGNOSIS — I129 Hypertensive chronic kidney disease with stage 1 through stage 4 chronic kidney disease, or unspecified chronic kidney disease: Secondary | ICD-10-CM | POA: Diagnosis present

## 2017-01-27 DIAGNOSIS — Z7984 Long term (current) use of oral hypoglycemic drugs: Secondary | ICD-10-CM

## 2017-01-27 DIAGNOSIS — R7989 Other specified abnormal findings of blood chemistry: Secondary | ICD-10-CM

## 2017-01-27 DIAGNOSIS — Z8673 Personal history of transient ischemic attack (TIA), and cerebral infarction without residual deficits: Secondary | ICD-10-CM

## 2017-01-27 DIAGNOSIS — R748 Abnormal levels of other serum enzymes: Secondary | ICD-10-CM | POA: Diagnosis present

## 2017-01-27 HISTORY — DX: Osteomyelitis, unspecified: M86.9

## 2017-01-27 LAB — COMPREHENSIVE METABOLIC PANEL
ALBUMIN: 2.6 g/dL — AB (ref 3.5–5.0)
ALT: 11 U/L — ABNORMAL LOW (ref 17–63)
ANION GAP: 13 (ref 5–15)
AST: 27 U/L (ref 15–41)
Alkaline Phosphatase: 111 U/L (ref 38–126)
BUN: 17 mg/dL (ref 6–20)
CHLORIDE: 100 mmol/L — AB (ref 101–111)
CO2: 29 mmol/L (ref 22–32)
Calcium: 8.8 mg/dL — ABNORMAL LOW (ref 8.9–10.3)
Creatinine, Ser: 1.2 mg/dL (ref 0.61–1.24)
GFR calc non Af Amer: 56 mL/min — ABNORMAL LOW (ref >60)
GLUCOSE: 130 mg/dL — AB (ref 65–99)
POTASSIUM: 3.8 mmol/L (ref 3.5–5.1)
SODIUM: 142 mmol/L (ref 135–145)
Total Bilirubin: 1.2 mg/dL (ref 0.3–1.2)
Total Protein: 6.9 g/dL (ref 6.5–8.1)

## 2017-01-27 LAB — BLOOD GAS, VENOUS
Acid-Base Excess: 13.5 mmol/L — ABNORMAL HIGH (ref 0.0–2.0)
Bicarbonate: 38.2 mmol/L — ABNORMAL HIGH (ref 20.0–28.0)
O2 SAT: 72.1 %
PATIENT TEMPERATURE: 37
pCO2, Ven: 49 mmHg (ref 44.0–60.0)
pH, Ven: 7.5 — ABNORMAL HIGH (ref 7.250–7.430)
pO2, Ven: 34 mmHg (ref 32.0–45.0)

## 2017-01-27 LAB — CBC
HCT: 26.8 % — ABNORMAL LOW (ref 40.0–52.0)
HEMOGLOBIN: 8.4 g/dL — AB (ref 13.0–18.0)
MCH: 26.2 pg (ref 26.0–34.0)
MCHC: 31.4 g/dL — AB (ref 32.0–36.0)
MCV: 83.5 fL (ref 80.0–100.0)
PLATELETS: 275 10*3/uL (ref 150–440)
RBC: 3.21 MIL/uL — ABNORMAL LOW (ref 4.40–5.90)
RDW: 20.5 % — AB (ref 11.5–14.5)
WBC: 4.2 10*3/uL (ref 3.8–10.6)

## 2017-01-27 LAB — URINALYSIS, COMPLETE (UACMP) WITH MICROSCOPIC
BACTERIA UA: NONE SEEN
BILIRUBIN URINE: NEGATIVE
GLUCOSE, UA: NEGATIVE mg/dL
HGB URINE DIPSTICK: NEGATIVE
KETONES UR: NEGATIVE mg/dL
Nitrite: NEGATIVE
PH: 5 (ref 5.0–8.0)
PROTEIN: 100 mg/dL — AB
Specific Gravity, Urine: 1.018 (ref 1.005–1.030)
Squamous Epithelial / LPF: NONE SEEN

## 2017-01-27 LAB — AMMONIA: AMMONIA: 41 umol/L — AB (ref 9–35)

## 2017-01-27 LAB — TROPONIN I: TROPONIN I: 0.03 ng/mL — AB (ref <0.03)

## 2017-01-27 MED ORDER — CEFTRIAXONE SODIUM IN DEXTROSE 20 MG/ML IV SOLN
1.0000 g | Freq: Once | INTRAVENOUS | Status: DC
  Administered 2017-01-27: 1 g via INTRAVENOUS
  Filled 2017-01-27: qty 50

## 2017-01-27 MED ORDER — BACLOFEN 10 MG PO TABS
10.0000 mg | ORAL_TABLET | Freq: Every day | ORAL | Status: DC | PRN
  Filled 2017-01-27: qty 1

## 2017-01-27 MED ORDER — DARIFENACIN HYDROBROMIDE ER 7.5 MG PO TB24
7.5000 mg | ORAL_TABLET | Freq: Every day | ORAL | Status: DC
  Administered 2017-01-28 – 2017-01-31 (×4): 7.5 mg via ORAL
  Filled 2017-01-27 (×5): qty 1

## 2017-01-27 MED ORDER — ASPIRIN 81 MG PO CHEW
324.0000 mg | CHEWABLE_TABLET | Freq: Once | ORAL | Status: AC
  Administered 2017-01-27: 324 mg via ORAL
  Filled 2017-01-27: qty 4

## 2017-01-27 MED ORDER — DOCUSATE SODIUM 100 MG PO CAPS: 100.0000 mg | ORAL_CAPSULE | Freq: Two times a day (BID) | ORAL | Status: DC | PRN

## 2017-01-27 MED ORDER — PIPERACILLIN SOD-TAZOBACTAM SO 3.375 (3-0.375) G IV SOLR: 3.3750 g | Freq: Three times a day (TID) | INTRAVENOUS | Status: DC

## 2017-01-27 MED ORDER — HYDRALAZINE HCL 20 MG/ML IJ SOLN
10.0000 mg | Freq: Four times a day (QID) | INTRAMUSCULAR | Status: DC | PRN
  Administered 2017-01-27 – 2017-01-28 (×2): 10 mg via INTRAVENOUS
  Filled 2017-01-27 (×2): qty 1

## 2017-01-27 MED ORDER — AMLODIPINE BESYLATE 5 MG PO TABS
5.0000 mg | ORAL_TABLET | Freq: Every day | ORAL | Status: DC
  Administered 2017-01-28 – 2017-01-31 (×4): 5 mg via ORAL
  Filled 2017-01-27 (×4): qty 1

## 2017-01-27 MED ORDER — ATORVASTATIN CALCIUM 20 MG PO TABS
40.0000 mg | ORAL_TABLET | Freq: Every day | ORAL | Status: DC
  Administered 2017-01-28 – 2017-01-31 (×4): 40 mg via ORAL
  Filled 2017-01-27 (×4): qty 2

## 2017-01-27 MED ORDER — GABAPENTIN 600 MG PO TABS
600.0000 mg | ORAL_TABLET | Freq: Every day | ORAL | Status: DC
  Administered 2017-01-28 – 2017-01-30 (×3): 600 mg via ORAL
  Filled 2017-01-27 (×3): qty 1

## 2017-01-27 MED ORDER — ACETAMINOPHEN 500 MG PO TABS
1000.0000 mg | ORAL_TABLET | Freq: Once | ORAL | Status: AC
  Administered 2017-01-27: 1000 mg via ORAL
  Filled 2017-01-27: qty 2

## 2017-01-27 MED ORDER — CITALOPRAM HYDROBROMIDE 20 MG PO TABS
20.0000 mg | ORAL_TABLET | Freq: Every day | ORAL | Status: DC
  Administered 2017-01-28: 20 mg via ORAL
  Filled 2017-01-27: qty 1

## 2017-01-27 MED ORDER — ALLOPURINOL 100 MG PO TABS
200.0000 mg | ORAL_TABLET | Freq: Every day | ORAL | Status: DC
  Administered 2017-01-28 – 2017-01-31 (×4): 200 mg via ORAL
  Filled 2017-01-27 (×4): qty 2

## 2017-01-27 MED ORDER — PIPERACILLIN-TAZOBACTAM 3.375 G IVPB
3.3750 g | Freq: Three times a day (TID) | INTRAVENOUS | Status: DC
  Administered 2017-01-28 – 2017-01-31 (×11): 3.375 g via INTRAVENOUS
  Filled 2017-01-27 (×11): qty 50

## 2017-01-27 MED ORDER — ZINC SULFATE 220 (50 ZN) MG PO CAPS
220.0000 mg | ORAL_CAPSULE | Freq: Every day | ORAL | Status: DC
  Administered 2017-01-28 – 2017-01-31 (×4): 220 mg via ORAL
  Filled 2017-01-27 (×4): qty 1

## 2017-01-27 MED ORDER — VANCOMYCIN HCL 10 G IV SOLR
1250.0000 mg | Freq: Once | INTRAVENOUS | Status: AC
  Administered 2017-01-28: 1250 mg via INTRAVENOUS
  Filled 2017-01-27: qty 1250

## 2017-01-27 MED ORDER — VANCOMYCIN HCL 10 G IV SOLR
1250.0000 mg | Freq: Every day | INTRAVENOUS | Status: DC
  Administered 2017-01-29 – 2017-01-31 (×3): 1250 mg via INTRAVENOUS
  Filled 2017-01-27 (×3): qty 1250

## 2017-01-27 MED ORDER — PIPERACILLIN-TAZOBACTAM 3.375 G IVPB 30 MIN
3.3750 g | Freq: Once | INTRAVENOUS | Status: AC
  Administered 2017-01-27: 3.375 g via INTRAVENOUS
  Filled 2017-01-27: qty 50

## 2017-01-27 MED ORDER — METFORMIN HCL 500 MG PO TABS: 500.0000 mg | ORAL_TABLET | Freq: Every day | ORAL | Status: DC

## 2017-01-27 MED ORDER — INSULIN ASPART 100 UNIT/ML ~~LOC~~ SOLN
0.0000 [IU] | Freq: Three times a day (TID) | SUBCUTANEOUS | Status: DC
  Administered 2017-01-28 (×2): 1 [IU] via SUBCUTANEOUS
  Administered 2017-01-30: 2 [IU] via SUBCUTANEOUS
  Administered 2017-01-30: 1 [IU] via SUBCUTANEOUS
  Filled 2017-01-27 (×4): qty 1

## 2017-01-27 MED ORDER — VITAMIN C 500 MG PO TABS
500.0000 mg | ORAL_TABLET | ORAL | Status: DC
  Administered 2017-01-28 – 2017-01-31 (×2): 500 mg via ORAL
  Filled 2017-01-27 (×2): qty 1

## 2017-01-27 MED ORDER — BENEPROTEIN PO POWD
6.0000 g | Freq: Three times a day (TID) | ORAL | Status: DC
  Administered 2017-01-28: 6 g via ORAL
  Filled 2017-01-27: qty 227

## 2017-01-27 MED ORDER — ASPIRIN EC 81 MG PO TBEC
81.0000 mg | DELAYED_RELEASE_TABLET | Freq: Every day | ORAL | Status: DC
  Administered 2017-01-28 – 2017-01-31 (×4): 81 mg via ORAL
  Filled 2017-01-27 (×4): qty 1

## 2017-01-27 MED ORDER — HEPARIN SODIUM (PORCINE) 5000 UNIT/ML IJ SOLN
5000.0000 [IU] | Freq: Three times a day (TID) | INTRAMUSCULAR | Status: DC
  Administered 2017-01-28 – 2017-01-31 (×9): 5000 [IU] via SUBCUTANEOUS
  Filled 2017-01-27 (×9): qty 1

## 2017-01-27 NOTE — ED Notes (Signed)
Pt reports chills and nausea.  Friend with pt.
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
## 2017-01-27 NOTE — Progress Notes (Signed)
Pt refusing all bedtime meds. Pt states "I don't take meds after 9 pm. I dont want any." Pt also refusing lab draws through his PICC. Pt became agitated and loud. MD Jannifer Franklin made aware. I will try again later to draw labs.
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
## 2017-01-27 NOTE — ED Notes (Signed)
Report called to kara rn floor nurse  

## 2017-01-27 NOTE — ED Triage Notes (Signed)
Pt brought in via ems from [facility] with altered mental status and htn.  Pt has a wound vac and picc line.  Pt receiving vancomycin for sacral wound.  Pt has dementia.

## 2017-01-27 NOTE — ED Notes (Signed)
Pt brought in via ems from [facility].  Pt was found by staff with elevated blood pressure and altered mental status today.   Nursing staff gave an additional novasc 5mg  at 1600 today.  Pt has dementia and per ems, staff said pt was altered more than usual.  Paced rhythm on monitor.  Wound vac in place.  Pt has bilateral aka.    md at bedside.

## 2017-01-27 NOTE — ED Notes (Signed)
Patient transported to CT 

## 2017-01-27 NOTE — ED Notes (Signed)
meds given for blood pressure.  Pt alert.  Paced rhythm on monitor.  Skin warm and dry.

## 2017-01-28 ENCOUNTER — Inpatient Hospital Stay
Admit: 2017-01-28 | Discharge: 2017-01-28 | Disposition: A | Discharge status: Home / Self Care | Payer: Medicare (Managed Care) | Attending: Infectious Diseases | Admitting: Infectious Diseases

## 2017-01-28 ENCOUNTER — Other Ambulatory Visit: Payer: Self-pay

## 2017-01-28 DIAGNOSIS — E1169 Type 2 diabetes mellitus with other specified complication: Secondary | ICD-10-CM | POA: Diagnosis present

## 2017-01-28 DIAGNOSIS — E1122 Type 2 diabetes mellitus with diabetic chronic kidney disease: Secondary | ICD-10-CM | POA: Diagnosis present

## 2017-01-28 DIAGNOSIS — R7881 Bacteremia: Secondary | ICD-10-CM | POA: Diagnosis present

## 2017-01-28 DIAGNOSIS — Z87891 Personal history of nicotine dependence: Secondary | ICD-10-CM | POA: Diagnosis not present

## 2017-01-28 DIAGNOSIS — E7220 Disorder of urea cycle metabolism, unspecified: Secondary | ICD-10-CM | POA: Diagnosis present

## 2017-01-28 DIAGNOSIS — G8220 Paraplegia, unspecified: Secondary | ICD-10-CM | POA: Diagnosis present

## 2017-01-28 DIAGNOSIS — Z79899 Other long term (current) drug therapy: Secondary | ICD-10-CM | POA: Diagnosis not present

## 2017-01-28 DIAGNOSIS — L89154 Pressure ulcer of sacral region, stage 4: Secondary | ICD-10-CM | POA: Diagnosis present

## 2017-01-28 DIAGNOSIS — Z89612 Acquired absence of left leg above knee: Secondary | ICD-10-CM | POA: Diagnosis not present

## 2017-01-28 DIAGNOSIS — I4891 Unspecified atrial fibrillation: Secondary | ICD-10-CM | POA: Diagnosis present

## 2017-01-28 DIAGNOSIS — W19XXXA Unspecified fall, initial encounter: Secondary | ICD-10-CM | POA: Diagnosis present

## 2017-01-28 DIAGNOSIS — E785 Hyperlipidemia, unspecified: Secondary | ICD-10-CM | POA: Diagnosis present

## 2017-01-28 DIAGNOSIS — I2510 Atherosclerotic heart disease of native coronary artery without angina pectoris: Secondary | ICD-10-CM | POA: Diagnosis present

## 2017-01-28 DIAGNOSIS — S065X9A Traumatic subdural hemorrhage with loss of consciousness of unspecified duration, initial encounter: Secondary | ICD-10-CM | POA: Diagnosis present

## 2017-01-28 DIAGNOSIS — Z89611 Acquired absence of right leg above knee: Secondary | ICD-10-CM | POA: Diagnosis not present

## 2017-01-28 DIAGNOSIS — Z66 Do not resuscitate: Secondary | ICD-10-CM | POA: Diagnosis present

## 2017-01-28 DIAGNOSIS — N3000 Acute cystitis without hematuria: Secondary | ICD-10-CM | POA: Diagnosis present

## 2017-01-28 DIAGNOSIS — R748 Abnormal levels of other serum enzymes: Secondary | ICD-10-CM | POA: Diagnosis present

## 2017-01-28 DIAGNOSIS — E1151 Type 2 diabetes mellitus with diabetic peripheral angiopathy without gangrene: Secondary | ICD-10-CM | POA: Diagnosis present

## 2017-01-28 DIAGNOSIS — Z955 Presence of coronary angioplasty implant and graft: Secondary | ICD-10-CM | POA: Diagnosis not present

## 2017-01-28 DIAGNOSIS — Z8673 Personal history of transient ischemic attack (TIA), and cerebral infarction without residual deficits: Secondary | ICD-10-CM | POA: Diagnosis not present

## 2017-01-28 DIAGNOSIS — I129 Hypertensive chronic kidney disease with stage 1 through stage 4 chronic kidney disease, or unspecified chronic kidney disease: Secondary | ICD-10-CM | POA: Diagnosis present

## 2017-01-28 DIAGNOSIS — E876 Hypokalemia: Secondary | ICD-10-CM | POA: Diagnosis present

## 2017-01-28 DIAGNOSIS — M869 Osteomyelitis, unspecified: Secondary | ICD-10-CM | POA: Diagnosis present

## 2017-01-28 LAB — AMMONIA: Ammonia: 49 umol/L — ABNORMAL HIGH (ref 9–35)

## 2017-01-28 LAB — BLOOD CULTURE ID PANEL (REFLEXED)
Acinetobacter baumannii: NOT DETECTED
CANDIDA KRUSEI: NOT DETECTED
CANDIDA PARAPSILOSIS: NOT DETECTED
Candida albicans: NOT DETECTED
Candida glabrata: NOT DETECTED
Candida tropicalis: DETECTED — AB
ENTEROCOCCUS SPECIES: NOT DETECTED
ESCHERICHIA COLI: NOT DETECTED
Enterobacter cloacae complex: NOT DETECTED
Enterobacteriaceae species: NOT DETECTED
Haemophilus influenzae: NOT DETECTED
Klebsiella oxytoca: NOT DETECTED
Klebsiella pneumoniae: NOT DETECTED
LISTERIA MONOCYTOGENES: NOT DETECTED
Neisseria meningitidis: NOT DETECTED
PROTEUS SPECIES: NOT DETECTED
Pseudomonas aeruginosa: NOT DETECTED
SERRATIA MARCESCENS: NOT DETECTED
STAPHYLOCOCCUS AUREUS BCID: NOT DETECTED
STREPTOCOCCUS PYOGENES: NOT DETECTED
Staphylococcus species: NOT DETECTED
Streptococcus agalactiae: NOT DETECTED
Streptococcus pneumoniae: NOT DETECTED
Streptococcus species: NOT DETECTED

## 2017-01-28 LAB — SEDIMENTATION RATE: SED RATE: 82 mm/h — AB (ref 0–20)

## 2017-01-28 LAB — CBC
HCT: 25.5 % — ABNORMAL LOW (ref 40.0–52.0)
Hemoglobin: 8.1 g/dL — ABNORMAL LOW (ref 13.0–18.0)
MCH: 26.4 pg (ref 26.0–34.0)
MCHC: 31.8 g/dL — ABNORMAL LOW (ref 32.0–36.0)
MCV: 83.3 fL (ref 80.0–100.0)
PLATELETS: 262 10*3/uL (ref 150–440)
RBC: 3.06 MIL/uL — ABNORMAL LOW (ref 4.40–5.90)
RDW: 20.7 % — AB (ref 11.5–14.5)
WBC: 3.9 10*3/uL (ref 3.8–10.6)

## 2017-01-28 LAB — BASIC METABOLIC PANEL
Anion gap: 9 (ref 5–15)
BUN: 17 mg/dL (ref 6–20)
CHLORIDE: 101 mmol/L (ref 101–111)
CO2: 30 mmol/L (ref 22–32)
Calcium: 8.8 mg/dL — ABNORMAL LOW (ref 8.9–10.3)
Creatinine, Ser: 1.19 mg/dL (ref 0.61–1.24)
GFR calc Af Amer: >60 mL/min (ref >60)
GFR calc non Af Amer: 56 mL/min — ABNORMAL LOW (ref >60)
GLUCOSE: 103 mg/dL — AB (ref 65–99)
Potassium: 3.1 mmol/L — ABNORMAL LOW (ref 3.5–5.1)
SODIUM: 140 mmol/L (ref 135–145)

## 2017-01-28 LAB — GLUCOSE, CAPILLARY
Glucose-Capillary: 97 mg/dL (ref 65–99)
Glucose-Capillary: 124 mg/dL — ABNORMAL HIGH (ref 65–99)
Glucose-Capillary: 121 mg/dL — ABNORMAL HIGH (ref 65–99)
Glucose-Capillary: 103 mg/dL — ABNORMAL HIGH (ref 65–99)

## 2017-01-28 LAB — MAGNESIUM: Magnesium: 1.6 mg/dL — ABNORMAL LOW (ref 1.7–2.4)

## 2017-01-28 LAB — VANCOMYCIN, TROUGH: Vancomycin Tr: 14 ug/mL — ABNORMAL LOW (ref 15–20)

## 2017-01-28 MED ORDER — FLUCONAZOLE IN SODIUM CHLORIDE 400-0.9 MG/200ML-% IV SOLN
800.0000 mg | Freq: Once | INTRAVENOUS | Status: AC
  Administered 2017-01-28: 800 mg via INTRAVENOUS
  Filled 2017-01-28: qty 400

## 2017-01-28 MED ORDER — MAGNESIUM SULFATE 2 GM/50ML IV SOLN
2.0000 g | Freq: Once | INTRAVENOUS | Status: AC
  Administered 2017-01-28: 2 g via INTRAVENOUS
  Filled 2017-01-28: qty 50

## 2017-01-28 MED ORDER — ONDANSETRON HCL 4 MG/2ML IJ SOLN
4.0000 mg | Freq: Four times a day (QID) | INTRAMUSCULAR | Status: DC | PRN
  Administered 2017-01-28: 4 mg via INTRAVENOUS
  Filled 2017-01-28: qty 2

## 2017-01-28 MED ORDER — POTASSIUM CHLORIDE CRYS ER 20 MEQ PO TBCR
60.0000 meq | EXTENDED_RELEASE_TABLET | Freq: Once | ORAL | Status: AC
  Administered 2017-01-28: 60 meq via ORAL
  Filled 2017-01-28: qty 3

## 2017-01-28 MED ORDER — LACTULOSE 10 GM/15ML PO SOLN
20.0000 g | Freq: Two times a day (BID) | ORAL | Status: DC
  Administered 2017-01-28 – 2017-01-30 (×4): 20 g via ORAL
  Filled 2017-01-28 (×5): qty 30

## 2017-01-28 MED ORDER — FLUCONAZOLE IN SODIUM CHLORIDE 200-0.9 MG/100ML-% IV SOLN
200.0000 mg | INTRAVENOUS | Status: DC
  Administered 2017-01-29 – 2017-01-30 (×2): 200 mg via INTRAVENOUS
  Filled 2017-01-28 (×3): qty 100

## 2017-01-28 NOTE — Progress Notes (Signed)
Pts BP is elevated. 10mg  prn hydrazaline given. I will continue to assess.

## 2017-01-28 NOTE — Progress Notes (Signed)
Pharmacist BCID note  Lab called with 2 out of 4 bottles: Blood cx positive for Candida Tropicalis.  Called to clinical pharmacist Theodis Shove.  Chinita Greenland PharmD Clinical Pharmacist 01/28/2017

## 2017-01-28 NOTE — Progress Notes (Signed)
CCMD reports 6 beat run v-tach. MD Marcille Blanco made aware. Pt sleeping with no concerns offered.

## 2017-01-28 NOTE — Clinical Social Work Note (Addendum)
CSW tried to contact [facility] left a message on voice mail awaiting for call back.  Patient was at [facility] plan to return back to SNF to continue IV antibiotics, CSW contacted SNF and they can accept patient back today.  11:50am  CSW received phone call back from [place] at [facility], they will pick patient up around 1pm today to transport him back to SNF.  Patient to be d/c'ed today to [facility] [place].  Patient and family agreeable to plans will transport via [facility] transportation RN to call report to [phone] room [phone].  2:32pm  CSW was informed by bedside nurse that patient's cultures have come back and now they will have to treat him with different IV antibiotics.  Patient may be at hospital over the weekend.  CSW updated [facility].  Jones Broom. Decl[place], MSW, [place]  01/28/2017 11:26 AM

## 2017-01-28 NOTE — Progress Notes (Signed)
Labs drawn successfully. Pt refusing telemetry box at this time.

## 2017-01-28 NOTE — NC FL2 (Signed)
[place] LEVEL OF CARE SCREENING TOOL     IDENTIFICATION  Patient Name: Roger Winters Birthdate: 02/24/1936 Sex: male Admission Date (Current Location): 01/27/2017  [place] and [place] Number:  Engineering geologist and Address:  [facility], [address] [place], [place] [address]      Provider Number: [id number]  Attending Physician Name and Address:  Demetrios Loll, MD  Relative Name and Phone Number:  Antoine Poche [phone]  [phone] or Loni Dolly [phone]  [phone]     Current Level of Care: Hospital Recommended Level of Care: [place] Prior Approval Number:    Date Approved/Denied:   PASRR Number: [id number]  Discharge Plan: SNF    Current Diagnoses: Patient Active Problem List   Diagnosis Date Noted  . Sepsis ([place]) 01/04/2017  . Pressure injury of right hip, stage 4 ([place])   . Altered mental status 09/12/2016  . GI bleed 12/26/2014    Orientation RESPIRATION BLADDER Height & Weight     Self  Normal Incontinent, Indwelling catheter Weight: 164 lb (74.4 kg) Height:  5\' 2"  (157.5 cm)  BEHAVIORAL SYMPTOMS/MOOD NEUROLOGICAL BOWEL NUTRITION STATUS      Continent Diet(Regular)  AMBULATORY STATUS COMMUNICATION OF NEEDS Skin   Extensive Assist Verbally Wound Vac                       Personal Care Assistance Level of Assistance  Bathing, Feeding, Dressing Bathing Assistance: Limited assistance Feeding assistance: Limited assistance Dressing Assistance: Limited assistance Total Care Assistance: Maximum assistance   Functional Limitations Info  Sight, Hearing, Speech Sight Info: Adequate Hearing Info: Adequate Speech Info: Adequate    SPECIAL CARE FACTORS FREQUENCY                       Contractures Contractures Info: Not present    Additional Factors Info  Code Status, Allergies Code Status Info: DNR Allergies Info: No Known Allergies            Current Medications (01/28/2017):  This is the current hospital active medication list Current Facility-Administered Medications  Medication Dose Route Frequency Provider Last Rate Last Dose  . allopurinol (ZYLOPRIM) tablet 200 mg  200 mg Oral Daily Vaughan Basta, MD   200 mg at 01/28/17 1034  . amLODipine (NORVASC) tablet 5 mg  5 mg Oral Daily Vaughan Basta, MD   5 mg at 01/28/17 1034  . aspirin EC tablet 81 mg  81 mg Oral Daily Vaughan Basta, MD   81 mg at 01/28/17 1033  . atorvastatin (LIPITOR) tablet 40 mg  40 mg Oral Daily Vaughan Basta, MD   40 mg at 01/28/17 1033  . baclofen (LIORESAL) tablet 10 mg  10 mg Oral Daily PRN Vaughan Basta, MD      . citalopram (CELEXA) tablet 20 mg  20 mg Oral Daily Vaughan Basta, MD   20 mg at 01/28/17 1033  . darifenacin (ENABLEX) 24 hr tablet 7.5 mg  7.5 mg Oral Daily Vaughan Basta, MD   7.5 mg at 01/28/17 1034  . docusate sodium (COLACE) capsule 100 mg  100 mg Oral BID PRN Vaughan Basta, MD      . gabapentin (NEURONTIN) tablet 600 mg  600 mg Oral QHS Vaughan Basta, MD      . heparin injection 5,000 Units  5,000 Units Subcutaneous Q8H Vaughan Basta, MD      . hydrALAZINE (APRESOLINE) injection 10 mg  10 mg Intravenous  Q6H PRN Vaughan Basta, MD   10 mg at 01/28/17 0726  . insulin aspart (novoLOG) injection 0-9 Units  0-9 Units Subcutaneous TID WC Vaughan Basta, MD      . lactulose (CHRONULAC) 10 GM/15ML solution 20 g  20 g Oral BID Harrie Foreman, MD      . magnesium sulfate IVPB 2 g 50 mL  2 g Intravenous Once Demetrios Loll, MD      . piperacillin-tazobactam (ZOSYN) IVPB 3.375 g  3.375 g Intravenous Q8H Vaughan Basta, MD 12.5 mL/hr at 01/28/17 1034 3.375 g at 01/28/17 1034  . protein supplement (RESOURCE BENEPROTEIN) powder 6 g  6 g Oral TID WC Vaughan Basta, MD      . Derrill Memo ON 01/29/2017] vancomycin (VANCOCIN) 1,250 mg in sodium  chloride 0.9 % 250 mL IVPB  1,250 mg Intravenous Daily Vaughan Basta, MD      . vitamin C (ASCORBIC ACID) tablet 500 mg  500 mg Oral Q M,W,F Vaughan Basta, MD   500 mg at 01/28/17 1034  . zinc sulfate capsule 220 mg  220 mg Oral Daily Vaughan Basta, MD   220 mg at 01/28/17 1034     Discharge Medications: Please see discharge summary for a list of discharge medications.  Relevant Imaging Results:  Relevant Lab Results:   Additional Information SSN [id number]  Ross Ludwig, [place]
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
## 2017-01-28 NOTE — Clinical Social Work Note (Signed)
Clinical Social Work Assessment  Patient Details  Name: Roger Winters MRN: [id number] Date of Birth: 1935-09-29  Date of referral:  01/28/17               Reason for consult:  Facility Placement                Permission sought to share information with:  Family Supports, Customer service manager Permission granted to share information::  Yes, Verbal Permission Granted  Name::     Antoine Poche [phone]  [phone] or Loni Dolly [phone]  [phone]   Agency::  SNF admissions  Relationship::     Contact Information:     Housing/Transportation Living arrangements for the past 2 months:  [place] of Information:  Medical Team Patient Interpreter Needed:  None Criminal Activity/Legal Involvement Pertinent to Current Situation/Hospitalization:  No - Comment as needed Significant Relationships:  Friend Lives with:  Facility Resident Do you feel safe going back to the place where you live?  Yes Need for family participation in patient care:  Yes (Comment)  Care giving concerns:  Patient plans to return back to [facility] [place].   Social Worker assessment / plan:  Patient is a 81 year old male who is alert and oriented x1.  Patient has dementia CSW completed assessment by reviewing patient's medical chart.  Patient participates in the [facility] program, and has been at [facility] for the past three weeks receiving iv antibiotics and wound care.  [facility] does not report any issues with patient returning back to SNF.  Patient's SNF placement is being paid for by [facility].  CSW to coordinate with [facility] and [facility] for discharge planning.  Employment status:  Retired Forensic scientist:  Other (Comment Required)([facility]) PT Recommendations:  Not assessed at this time Information / Referral to community resources:  [place]  Patient/Family's Response to care:  [facility] program does not have any concerns of  going back to [facility].  Patient/Family's Understanding of and Emotional Response to Diagnosis, Current Treatment, and Prognosis:  Patient has some dementia and is not aware of current prognosis.  Emotional Assessment Appearance:  Appears stated age Attitude/Demeanor/Rapport:    Affect (typically observed):  Calm, Stable Orientation:  Oriented to Self Alcohol / Substance use:  Not Applicable Psych involvement (Current and /or in the community):  No (Comment)  Discharge Needs  Concerns to be addressed:  Care Coordination Readmission within the last 30 days:  Yes(01/05/17 to [facility] [place]) Current discharge risk:  None Barriers to Discharge:  Continued Medical Work up   Anell Barr 01/28/2017, 4:31 PM

## 2017-01-28 NOTE — Plan of Care (Signed)
Pt encouraged to call for assistance with activity. Pt is also encouraged to eat at meal time.

## 2017-01-28 NOTE — Progress Notes (Signed)
Pharmacy Antibiotic Note  Roger Winters is a 81 y.o. male admitted on 01/27/2017 with osteomyelitis.  Pharmacy has been consulted for vancomycin and piperacillin/tazobactam dosing.  Patient was recently hospitalized with a sacral ulcer and diagnosed with OM and was discharged with a PICC line to continue piperacillin/tazobactam and vancomycin.   Patient has been receiving vancomycin 1250 mg IV daily PTA Antibiotics to be continued through 12/19 per previous admission notes  Plan: Vancomycin random level obtained on admission = 14 mcg/mL. Unclear as to if this represents a true trough in relation to last dose, but will continue current dose since it is not supratherapeutic.   Continue vancomycin 1250 mg IV q24h Will check VT prior to 4th dose on 12/3  Continue piperacillin/tazobactam 3.375 g IV q8h EI  Height: 5\' 2"  (157.5 cm) Weight: 164 lb (74.4 kg) IBW/kg (Calculated) : 54.6  Temp (24hrs), Avg:97.6 F (36.4 C), Min:97.4 F (36.3 C), Max:97.8 F (36.6 C)  Recent Labs  Lab 01/27/17 1750 01/28/17 0238  WBC 4.2 3.9  CREATININE 1.20 1.19  VANCOTROUGH  --  14*    Estimated Creatinine Clearance: 44.5 mL/min (by C-G formula based on SCr of 1.19 mg/dL).    No Known Allergies  Antimicrobials this admission: Piperacillin/tazobactam 11/29 >>  vancomycin 11/30 >>   Dose adjustments this admission:  Microbiology results: 11/29 BCx: No growth < 24 hours 11/19 UCx: Sent   Thank you for allowing pharmacy to be a part of this patient's care.  Lenis Noon, PharmD, BCPS Clinical Pharmacist 01/28/2017 8:35 AM

## 2017-01-28 NOTE — Care Management (Signed)
Assessed by ID and + blood culture for candidemia. PICC to be removed and reinsert once blodd cultures negative for 48 hours

## 2017-01-28 NOTE — Progress Notes (Signed)
Pt complains of nausea no vomitus. MD notified. Orders for zofran received. I will continue to assess.
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
## 2017-01-28 NOTE — Progress Notes (Signed)
Pharmacy Antibiotic Note  YIFAN AUKER is a 81 y.o. male admitted on 01/27/2017 with candidemia.  Pharmacy has been consulted for fluconazole dosing.  Plan: Fluconazole 800 mg IV once followed by fluconazole 200 mg IV daily  Height: 5\' 2"  (157.5 cm) Weight: 164 lb (74.4 kg) IBW/kg (Calculated) : 54.6  Temp (24hrs), Avg:97.6 F (36.4 C), Min:97.4 F (36.3 C), Max:97.8 F (36.6 C)  Recent Labs  Lab 01/27/17 1750 01/28/17 0238  WBC 4.2 3.9  CREATININE 1.20 1.19  VANCOTROUGH  --  14*    Estimated Creatinine Clearance: 44.5 mL/min (by C-G formula based on SCr of 1.19 mg/dL).    No Known Allergies   Thank you for allowing pharmacy to be a part of this patient's care.  Darylene Price Delfino Friesen 01/28/2017 2:11 PM

## 2017-01-28 NOTE — Progress Notes (Signed)
PHARMACY - PHYSICIAN COMMUNICATION CRITICAL VALUE ALERT - BLOOD CULTURE IDENTIFICATION (BCID)  Roger Winters is an 81 y.o. male who presented to [facility] on 01/27/2017   Name of physician (or Provider) Contacted: Dr. Bridgett Larsson  Current antibiotics: piperacillin/tazobactam and vancomycin for OM.  Changes to prescribed antibiotics recommended:  Continue current antibiotics and start fluconazole for candidemia.  Results for orders placed or performed during the hospital encounter of 01/27/17  Blood Culture ID Panel (Reflexed) (Collected: 01/27/2017  5:51 PM)  Result Value Ref Range   Enterococcus species NOT DETECTED NOT DETECTED   Listeria monocytogenes NOT DETECTED NOT DETECTED   Staphylococcus species NOT DETECTED NOT DETECTED   Staphylococcus aureus NOT DETECTED NOT DETECTED   Streptococcus species NOT DETECTED NOT DETECTED   Streptococcus agalactiae NOT DETECTED NOT DETECTED   Streptococcus pneumoniae NOT DETECTED NOT DETECTED   Streptococcus pyogenes NOT DETECTED NOT DETECTED   Acinetobacter baumannii NOT DETECTED NOT DETECTED   Enterobacteriaceae species NOT DETECTED NOT DETECTED   Enterobacter cloacae complex NOT DETECTED NOT DETECTED   Escherichia coli NOT DETECTED NOT DETECTED   Klebsiella oxytoca NOT DETECTED NOT DETECTED   Klebsiella pneumoniae NOT DETECTED NOT DETECTED   Proteus species NOT DETECTED NOT DETECTED   Serratia marcescens NOT DETECTED NOT DETECTED   Haemophilus influenzae NOT DETECTED NOT DETECTED   Neisseria meningitidis NOT DETECTED NOT DETECTED   Pseudomonas aeruginosa NOT DETECTED NOT DETECTED   Candida albicans NOT DETECTED NOT DETECTED   Candida glabrata NOT DETECTED NOT DETECTED   Candida krusei NOT DETECTED NOT DETECTED   Candida parapsilosis NOT DETECTED NOT DETECTED   Candida tropicalis DETECTED (A) NOT [place], PharmD, BCPS Clinical Pharmacist 01/28/2017  1:31 PM

## 2017-01-28 NOTE — Care Management Obs Status (Signed)
[place] NOTIFICATION   Patient Details  Name: Roger Winters MRN: [id number] Date of Birth: 08/21/1935   Medicare Observation Status Notification Given:  No(Admitted obs less than 24 hours)    Beverly Sessions, RN 01/28/2017, 11:11 AM

## 2017-01-29 LAB — ECHOCARDIOGRAM COMPLETE
FS: 24 % — AB (ref 28–44)
Height: 62 in
IVS/LV PW RATIO, ED: 1.27
LA ID, A-P, ES: 49 mm
LA diam end sys: 49 mm
LA vol index: 49.2 mL/m2
LADIAMINDEX: 2.78 cm/m2
LAVOL: 86.6 mL
LAVOLA4C: 75 mL
LDCA: 3.46 cm2
LVOT VTI: 13.9 cm
LVOT diameter: 21 mm
LVOTPV: 70.6 cm/s
LVOTSV: 48 mL
MV Peak grad: 5 mmHg
MV pk E vel: 110 m/s
PW: 15.7 mm — AB (ref 0.6–1.1)
RV LATERAL S' VELOCITY: 6.64 cm/s
RV TAPSE: 19.5 mm
Weight: 2624 oz

## 2017-01-29 LAB — BASIC METABOLIC PANEL
ANION GAP: 9 (ref 5–15)
BUN: 18 mg/dL (ref 6–20)
CALCIUM: 8.7 mg/dL — AB (ref 8.9–10.3)
CHLORIDE: 103 mmol/L (ref 101–111)
CO2: 27 mmol/L (ref 22–32)
Creatinine, Ser: 1.09 mg/dL (ref 0.61–1.24)
GLUCOSE: 78 mg/dL (ref 65–99)
Potassium: 4 mmol/L (ref 3.5–5.1)
Sodium: 139 mmol/L (ref 135–145)

## 2017-01-29 LAB — URINE CULTURE

## 2017-01-29 LAB — GLUCOSE, CAPILLARY
GLUCOSE-CAPILLARY: 112 mg/dL — AB (ref 65–99)
GLUCOSE-CAPILLARY: 104 mg/dL — AB (ref 65–99)
Glucose-Capillary: 87 mg/dL (ref 65–99)
Glucose-Capillary: 111 mg/dL — ABNORMAL HIGH (ref 65–99)

## 2017-01-29 LAB — MAGNESIUM: MAGNESIUM: 2.3 mg/dL (ref 1.7–2.4)

## 2017-01-29 NOTE — Progress Notes (Signed)
[place] at [place] NAME: Roger Winters    MR#:  [id number]  DATE OF BIRTH:  [birth year]  SUBJECTIVE:  CHIEF COMPLAINT:   Chief Complaint  Patient presents with  . Altered Mental Status  . Hypertension   Patient has no complaints. REVIEW OF SYSTEMS:  Review of Systems  Constitutional: Negative for chills, fever and malaise/fatigue.  HENT: Negative for sore throat.   Eyes: Negative for blurred vision and double vision.  Respiratory: Negative for cough, hemoptysis, shortness of breath, wheezing and stridor.   Cardiovascular: Negative for chest pain, palpitations, orthopnea and leg swelling.  Gastrointestinal: Negative for abdominal pain, blood in stool, diarrhea, melena, nausea and vomiting.  Genitourinary: Negative for dysuria, flank pain and hematuria.  Musculoskeletal: Negative for back pain and joint pain.  Neurological: Negative for dizziness, sensory change, focal weakness, seizures, loss of consciousness, weakness and headaches.  Endo/Heme/Allergies: Negative for polydipsia.  Psychiatric/Behavioral: Negative for depression. The patient is not nervous/anxious.     DRUG ALLERGIES:  No Known Allergies VITALS:  Blood pressure 137/89, pulse 63, temperature 98.3 F (36.8 C), temperature source Oral, resp. rate 18, height 5\' 2"  (1.575 m), weight 164 lb (74.4 kg), SpO2 94 %. PHYSICAL EXAMINATION:  Physical Exam  Constitutional: He is oriented to person, place, and time and well-developed, well-nourished, and in no distress.  HENT:  Head: Normocephalic.  Mouth/Throat: Oropharynx is clear and moist.  Eyes: Conjunctivae and EOM are normal. Pupils are equal, round, and reactive to light. No scleral icterus.  Neck: Normal range of motion. Neck supple. No JVD present. No tracheal deviation present.  Cardiovascular: Normal rate, regular rhythm and normal heart sounds. Exam reveals no gallop.  No murmur heard. Pulmonary/Chest: Effort  normal and breath sounds normal. No respiratory distress. He has no wheezes. He has no rales.  Abdominal: Soft. Bowel sounds are normal. He exhibits no distension. There is no tenderness. There is no rebound.  Musculoskeletal: Normal range of motion. He exhibits no edema or tenderness.  Left AKA. 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  Neurological: He is alert and oriented to person, place, and time. No cranial nerve deficit.  Skin: No rash noted. No erythema.  Psychiatric: Affect normal.   LABORATORY PANEL:  Male CBC Recent Labs  Lab 01/28/17 0238  WBC 3.9  HGB 8.1*  HCT 25.5*  PLT 262   ------------------------------------------------------------------------------------------------------------------ Chemistries  Recent Labs  Lab 01/27/17 1750  01/29/17 0559  NA 142   < > 139  K 3.8   < > 4.0  CL 100*   < > 103  CO2 29   < > 27  GLUCOSE 130*   < > 78  BUN 17   < > 18  CREATININE 1.20   < > 1.09  CALCIUM 8.8*   < > 8.7*  MG  --    < > 2.3  AST 27  --   --   ALT 11*  --   --   ALKPHOS 111  --   --   BILITOT 1.2  --   --    < > = values in this interval not displayed.   RADIOLOGY:  No results found. ASSESSMENT AND PLAN:    * Altered mental status, unclear etiology, possible related to bacteremia.  Improved. Hematoma findings in CT head is not acute. He had a fall 1 week ago. UA showed too numerous WBC, follow-up urine culture.  Candida bacteremia.  Follow-up repeated blood  culture, replace PICC line if negative for 48 hours. Fluconazole for 10 days per Dr. Ola Spurr.  Hypokalemia and hypomagnesemia.  Improved with supplement.  * Osteomyelitis COnt Vanc+ Zosyn until Dec. 19.  * Hypertension COnt home meds, PRN IV hydralazine.  * DM Cont meds, keep on ISS>  * hyperlipidemia  Cont statin.  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  All the records are reviewed and case discussed with Care Management/Social Worker. Management plans discussed with the  patient, and they are in agreement.  CODE STATUS: DNR  TOTAL TIME TAKING CARE OF THIS PATIENT: 33 minutes.   More than 50% of the time was spent in counseling/coordination of care: YES  POSSIBLE D/C IN 2-3 DAYS, DEPENDING ON CLINICAL CONDITION.   Demetrios Loll M.D on 01/29/2017 at 1:48 PM  Between 7am to 6pm - Pager - [phone]  After 6pm go to [email or web address] - [facility]

## 2017-01-29 NOTE — Progress Notes (Signed)
Pt. Given HS snack. He at 100% of snack.

## 2017-01-30 LAB — GLUCOSE, CAPILLARY
GLUCOSE-CAPILLARY: 174 mg/dL — AB (ref 65–99)
GLUCOSE-CAPILLARY: 115 mg/dL — AB (ref 65–99)
GLUCOSE-CAPILLARY: 103 mg/dL — AB (ref 65–99)
Glucose-Capillary: 136 mg/dL — ABNORMAL HIGH (ref 65–99)

## 2017-01-30 NOTE — Progress Notes (Signed)
Spoke with RN re PICC to be placed 01-31-17 by IV Team.

## 2017-01-30 NOTE — Progress Notes (Signed)
Pharmacy Antibiotic Note  Roger Winters is a 81 y.o. male admitted on 01/27/2017 with osteomyelitis.  Pharmacy has been consulted for vancomycin and piperacillin/tazobactam dosing.  Patient was recently hospitalized with a sacral ulcer and diagnosed with OM and was discharged with a PICC line to continue piperacillin/tazobactam and vancomycin.   Patient has been receiving vancomycin 1250 mg IV daily PTA Antibiotics to be continued through 12/19 per previous admission notes  Plan: Vancomycin random level obtained on admission = 14 mcg/mL. Unclear as to if this represents a true trough in relation to last dose, but will continue current dose since it is not supratherapeutic.   Continue vancomycin 1250 mg IV q24h Will check VT prior to 4th dose on 12/3, with SCr.  Continue piperacillin/tazobactam 3.375 g IV q8h EI  Height: 5\' 2"  (157.5 cm) Weight: 164 lb (74.4 kg) IBW/kg (Calculated) : 54.6  Temp (24hrs), Avg:98.7 F (37.1 C), Min:98.1 F (36.7 C), Max:99.3 F (37.4 C)  Recent Labs  Lab 01/27/17 1750 01/28/17 0238 01/29/17 0559  WBC 4.2 3.9  --   CREATININE 1.20 1.19 1.09  VANCOTROUGH  --  14*  --     Estimated Creatinine Clearance: 48.6 mL/min (by C-G formula based on SCr of 1.09 mg/dL).    No Known Allergies  Antimicrobials this admission: Piperacillin/tazobactam 11/29 >>  vancomycin 11/30 >>   Dose adjustments this admission:  Microbiology results: 11/29 BCx: No growth < 24 hours 11/19 UCx: Sent   Thank you for allowing pharmacy to be a part of this patient's care.  Rocky Morel, PharmD, BCPS Clinical Pharmacist 01/30/2017 11:48 AM

## 2017-01-30 NOTE — Progress Notes (Signed)
Roger Winters at [place] NAME: Roger Winters    MR#:  [id number]  DATE OF BIRTH:  1936-01-09  SUBJECTIVE:  CHIEF COMPLAINT:   Chief Complaint  Patient presents with  . Altered Mental Status  . Hypertension   Patient has no complaints but confused. REVIEW OF SYSTEMS:  Review of Systems  Constitutional: Negative for chills, fever and malaise/fatigue.  HENT: Negative for sore throat.   Eyes: Negative for blurred vision and double vision.  Respiratory: Negative for cough, hemoptysis, shortness of breath, wheezing and stridor.   Cardiovascular: Negative for chest pain, palpitations, orthopnea and leg swelling.  Gastrointestinal: Negative for abdominal pain, blood in stool, diarrhea, melena, nausea and vomiting.  Genitourinary: Negative for dysuria, flank pain and hematuria.  Musculoskeletal: Negative for back pain and joint pain.  Neurological: Negative for dizziness, sensory change, focal weakness, seizures, loss of consciousness, weakness and headaches.  Endo/Heme/Allergies: Negative for polydipsia.  Psychiatric/Behavioral: Negative for depression. The patient is not nervous/anxious.     DRUG ALLERGIES:  No Known Allergies VITALS:  Blood pressure 128/73, pulse 62, temperature 98.1 F (36.7 C), temperature source Oral, resp. rate 18, height 5\' 2"  (1.575 m), weight 164 lb (74.4 kg), SpO2 96 %. PHYSICAL EXAMINATION:  Physical Exam  Constitutional: He is well-developed, well-nourished, and in no distress.  HENT:  Head: Normocephalic.  Mouth/Throat: Oropharynx is clear and moist.  Eyes: Conjunctivae and EOM are normal. Pupils are equal, round, and reactive to light. No scleral icterus.  Neck: Normal range of motion. Neck supple. No JVD present. No tracheal deviation present.  Cardiovascular: Normal rate, regular rhythm and normal heart sounds. Exam reveals no gallop.  No murmur heard. Pulmonary/Chest: Effort normal and breath sounds  normal. No respiratory distress. He has no wheezes. He has no rales.  Abdominal: Soft. Bowel sounds are normal. He exhibits no distension. There is no tenderness. There is no rebound.  Musculoskeletal: Normal range of motion. He exhibits no edema or tenderness.  B/L AKA. 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  Neurological: He is alert. No cranial nerve deficit.  AAOx2  Skin: No rash noted. No erythema.   LABORATORY PANEL:  Male CBC Recent Labs  Lab 01/28/17 0238  WBC 3.9  HGB 8.1*  HCT 25.5*  PLT 262   ------------------------------------------------------------------------------------------------------------------ Chemistries  Recent Labs  Lab 01/27/17 1750  01/29/17 0559  NA 142   < > 139  K 3.8   < > 4.0  CL 100*   < > 103  CO2 29   < > 27  GLUCOSE 130*   < > 78  BUN 17   < > 18  CREATININE 1.20   < > 1.09  CALCIUM 8.8*   < > 8.7*  MG  --    < > 2.3  AST 27  --   --   ALT 11*  --   --   ALKPHOS 111  --   --   BILITOT 1.2  --   --    < > = values in this interval not displayed.   RADIOLOGY:  No results found. ASSESSMENT AND PLAN:    * Altered mental status, unclear etiology, possible related to bacteremia.  Improved. Hematoma findings in CT head is not acute. He had a fall 1 week ago. UA showed too numerous WBC, urine culture: yeast.  Candida bacteremia.  negative repeated blood culture so far, replace PICC line if negative for 48 hours. Fluconazole for 10  days per Dr. Ola Spurr.  Hypokalemia and hypomagnesemia.  Improved with supplement. Hypomagnesemia.  Improved with magnesium supplement.  * Osteomyelitis COnt Vanc+ Zosyn until Dec. 19.  * Hypertension COnt home meds, PRN IV hydralazine.  * DM Cont meds, keep on ISS>  * hyperlipidemia  Cont statin.  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  All the records are reviewed and case discussed with Care Management/Social Worker. Management plans discussed with the patient, and they are in  agreement.  CODE STATUS: DNR  TOTAL TIME TAKING CARE OF THIS PATIENT: 25 minutes.   More than 50% of the time was spent in counseling/coordination of care: YES  POSSIBLE D/C IN 1-2 DAYS, DEPENDING ON CLINICAL CONDITION.   Demetrios Loll M.D on 01/30/2017 at 1:42 PM  Between 7am to 6pm - Pager - [phone]  After 6pm go to [email or web address] - [facility]

## 2017-01-30 NOTE — Progress Notes (Signed)
Pt. Slept throughout the night, waking only to be changed when soiled with BM. No c/o pain, SOB or acute distress observed. Wound vac changed x2 related to stool penetrating dressing. Pt. Turned q2H throughout the night.

## 2017-01-30 NOTE — Progress Notes (Signed)
Spoke with IV team. Will defer PICC placement until tomorrow when they are available. Pt has waring IV access at this time.

## 2017-01-31 ENCOUNTER — Inpatient Hospital Stay: Payer: Medicare (Managed Care)

## 2017-01-31 LAB — CULTURE, BLOOD (ROUTINE X 2)

## 2017-01-31 LAB — CREATININE, SERUM
CREATININE: 1.82 mg/dL — AB (ref 0.61–1.24)
GFR calc Af Amer: 39 mL/min — ABNORMAL LOW (ref >60)
GFR calc non Af Amer: 34 mL/min — ABNORMAL LOW (ref >60)

## 2017-01-31 LAB — CBC
HCT: 23.8 % — ABNORMAL LOW (ref 40.0–52.0)
HEMOGLOBIN: 7.6 g/dL — AB (ref 13.0–18.0)
MCH: 26.6 pg (ref 26.0–34.0)
MCHC: 32 g/dL (ref 32.0–36.0)
MCV: 83.2 fL (ref 80.0–100.0)
Platelets: 247 10*3/uL (ref 150–440)
RBC: 2.87 MIL/uL — ABNORMAL LOW (ref 4.40–5.90)
RDW: 20.6 % — ABNORMAL HIGH (ref 11.5–14.5)
WBC: 3.4 10*3/uL — ABNORMAL LOW (ref 3.8–10.6)

## 2017-01-31 LAB — GLUCOSE, CAPILLARY
GLUCOSE-CAPILLARY: 106 mg/dL — AB (ref 65–99)
GLUCOSE-CAPILLARY: 127 mg/dL — AB (ref 65–99)

## 2017-01-31 LAB — VANCOMYCIN, TROUGH: Vancomycin Tr: 45 ug/mL (ref 15–20)

## 2017-01-31 MED ORDER — SODIUM CHLORIDE 0.9% FLUSH: 10.0000 mL | INTRAVENOUS | Status: DC | PRN

## 2017-01-31 MED ORDER — SODIUM CHLORIDE 0.9% FLUSH: 10.0000 mL | Freq: Two times a day (BID) | INTRAVENOUS | Status: DC

## 2017-01-31 MED ORDER — FLUCONAZOLE IN SODIUM CHLORIDE 200-0.9 MG/100ML-% IV SOLN: 200.0000 mg | INTRAVENOUS | Status: AC

## 2017-01-31 MED ORDER — VANCOMYCIN HCL 10 G IV SOLR: 1250.0000 mg | INTRAVENOUS | Status: DC | PRN

## 2017-01-31 NOTE — Progress Notes (Signed)
Roger Winters INFECTIOUS DISEASE PROGRESS NOTE Date of Admission:  01/27/2017     ID: Roger Winters is a 81 y.o. male with candidemia Active Problems:   Altered mental status   Bacteremia  Subjective: FU BCX 11/30 negative. Echo done and negative.  No fevers, out of unit.  ROS  Eleven systems are reviewed and negative except per hpi  Medications:  Antibiotics Given (last 72 hours)    Date/Time Action Medication Dose Rate   01/28/17 1741 New Bag/Given   piperacillin-tazobactam (ZOSYN) IVPB 3.375 g 3.375 g 12.5 mL/hr   01/29/17 0236 New Bag/Given   piperacillin-tazobactam (ZOSYN) IVPB 3.375 g 3.375 g 12.5 mL/hr   01/29/17 6433 New Bag/Given   vancomycin (VANCOCIN) 1,250 mg in sodium chloride 0.9 % 250 mL IVPB 1,250 mg 166.7 mL/hr   01/29/17 1010 New Bag/Given   piperacillin-tazobactam (ZOSYN) IVPB 3.375 g 3.375 g 12.5 mL/hr   01/29/17 1824 New Bag/Given   piperacillin-tazobactam (ZOSYN) IVPB 3.375 g 3.375 g 12.5 mL/hr   01/30/17 0041 New Bag/Given   piperacillin-tazobactam (ZOSYN) IVPB 3.375 g 3.375 g 12.5 mL/hr   01/30/17 0554 New Bag/Given   vancomycin (VANCOCIN) 1,250 mg in sodium chloride 0.9 % 250 mL IVPB 1,250 mg 166.7 mL/hr   01/30/17 0845 New Bag/Given   piperacillin-tazobactam (ZOSYN) IVPB 3.375 g 3.375 g 12.5 mL/hr   01/30/17 1758 New Bag/Given   piperacillin-tazobactam (ZOSYN) IVPB 3.375 g 3.375 g 12.5 mL/hr   01/31/17 0027 New Bag/Given   piperacillin-tazobactam (ZOSYN) IVPB 3.375 g 3.375 g 12.5 mL/hr   01/31/17 2951 New Bag/Given   vancomycin (VANCOCIN) 1,250 mg in sodium chloride 0.9 % 250 mL IVPB 1,250 mg 166.7 mL/hr   01/31/17 8841 New Bag/Given   piperacillin-tazobactam (ZOSYN) IVPB 3.375 g 3.375 g 12.5 mL/hr     . allopurinol  200 mg Oral Daily  . amLODipine  5 mg Oral Daily  . aspirin EC  81 mg Oral Daily  . atorvastatin  40 mg Oral Daily  . darifenacin  7.5 mg Oral Daily  . gabapentin  600 mg Oral QHS  . heparin  5,000 Units Subcutaneous Q8H  .  insulin aspart  0-9 Units Subcutaneous TID WC  . lactulose  20 g Oral BID  . protein supplement  6 g Oral TID WC  . sodium chloride flush  10-40 mL Intracatheter Q12H  . vitamin C  500 mg Oral Q M,W,F  . zinc sulfate  220 mg Oral Daily    Objective: Vital signs in last 24 hours: Temp:  [97.5 F (36.4 C)-97.7 F (36.5 C)] 97.5 F (36.4 C) (12/03 0728) Pulse Rate:  [62-64] 64 (12/03 0728) Resp:  [16-17] 17 (12/03 0418) BP: (120-157)/(76-92) 147/82 (12/03 0728) SpO2:  [94 %-97 %] 94 % (12/03 0728) Constitutional: He is oriented to person, place, and time. He appears well-developed and well-nourished. No distress.  HENT: anicteric Mouth/Throat: Oropharynx is clear and moist. No oropharyngeal exudate.  Cardiovascular: Normal rate, regular rhythm and normal heart sounds. Exam reveals no gallop and no friction rub.  No murmur heard.  Pulmonary/Chest: Effort normal and breath sounds normal. No respiratory distress. He has no wheezes.  Abdominal: Soft. Bowel sounds are normal. He exhibits no distension. There is no tenderness. Ext bil aka.  Neurological: He is alert and oriented to person, place, and time.  Skin:R ischial decub ulcer covered with wound vac Psychiatric: He has a normal mood and affect. His behavior is normal.  Lab Results Recent Labs    01/29/17  0559 01/31/17 0527  WBC  --  3.4*  HGB  --  7.6*  HCT  --  23.8*  NA 139  --   K 4.0  --   CL 103  --   CO2 27  --   BUN 18  --   CREATININE 1.09 1.82*    Microbiology: Results for orders placed or performed during the hospital encounter of 01/27/17  Blood culture (routine x 2)     Status: Abnormal (Preliminary result)   Collection Time: 01/27/17  5:51 PM  Result Value Ref Range Status   Specimen Description BLOOD RAC  Final   Special Requests   Final    BOTTLES DRAWN AEROBIC AND ANAEROBIC Blood Culture results may not be optimal due to an excessive volume of blood received in culture bottles   Culture  Setup  Time (A)  Final    YEAST IN BOTH AEROBIC AND ANAEROBIC BOTTLES CRITICAL RESULT CALLED TO, READ BACK BY AND VERIFIED WITH: Roger Winters AT 2426 01/28/17 SDR    Culture (A)  Final    CANDIDA TROPICALIS CULTURE REINCUBATED FOR BETTER GROWTH Performed at [facility], [place] [address] [place], [place] [address]    Report Status PENDING  Incomplete  Urine culture     Status: Abnormal   Collection Time: 01/27/17  5:51 PM  Result Value Ref Range Status   Specimen Description URINE, CLEAN CATCH  Final   Special Requests NONE  Final   Culture >=100,000 COLONIES/mL YEAST (A)  Final   Report Status 01/29/2017 FINAL  Final  Blood Culture ID Panel (Reflexed)     Status: Abnormal   Collection Time: 01/27/17  5:51 PM  Result Value Ref Range Status   Enterococcus species NOT DETECTED NOT DETECTED Final   Listeria monocytogenes NOT DETECTED NOT DETECTED Final   Staphylococcus species NOT DETECTED NOT DETECTED Final   Staphylococcus aureus NOT DETECTED NOT DETECTED Final   Streptococcus species NOT DETECTED NOT DETECTED Final   Streptococcus agalactiae NOT DETECTED NOT DETECTED Final   Streptococcus pneumoniae NOT DETECTED NOT DETECTED Final   Streptococcus pyogenes NOT DETECTED NOT DETECTED Final   Acinetobacter baumannii NOT DETECTED NOT DETECTED Final   Enterobacteriaceae species NOT DETECTED NOT DETECTED Final   Enterobacter cloacae complex NOT DETECTED NOT DETECTED Final   Escherichia coli NOT DETECTED NOT DETECTED Final   Klebsiella oxytoca NOT DETECTED NOT DETECTED Final   Klebsiella pneumoniae NOT DETECTED NOT DETECTED Final   Proteus species NOT DETECTED NOT DETECTED Final   Serratia marcescens NOT DETECTED NOT DETECTED Final   Haemophilus influenzae NOT DETECTED NOT DETECTED Final   Neisseria meningitidis NOT DETECTED NOT DETECTED Final   Pseudomonas aeruginosa NOT DETECTED NOT DETECTED Final   Candida albicans NOT DETECTED NOT DETECTED Final   Candida glabrata NOT DETECTED  NOT DETECTED Final   Candida krusei NOT DETECTED NOT DETECTED Final   Candida parapsilosis NOT DETECTED NOT DETECTED Final   Candida tropicalis DETECTED (A) NOT DETECTED Final    Comment: CRITICAL RESULT CALLED TO, READ BACK BY AND VERIFIED WITH:  Roger Winters AT 6222 01/28/17 SDR   Blood culture (routine x 2)     Status: None (Preliminary result)   Collection Time: 01/27/17  6:06 PM  Result Value Ref Range Status   Specimen Description BLOOD BLOOD RIGHT HAND  Final   Special Requests   Final    BOTTLES DRAWN AEROBIC AND ANAEROBIC Blood Culture adequate volume   Culture NO GROWTH 4 DAYS  Final   Report Status PENDING  Incomplete  Culture, blood (single) w Reflex to ID Panel     Status: None (Preliminary result)   Collection Time: 01/28/17  4:42 PM  Result Value Ref Range Status   Specimen Description BLOOD LEFT HAND  Final   Special Requests   Final    BOTTLES DRAWN AEROBIC AND ANAEROBIC Blood Culture adequate volume   Culture NO GROWTH 3 DAYS  Final   Report Status PENDING  Incomplete   Studies/Results: Dg Chest Port 1 View  Result Date: 01/31/2017 CLINICAL DATA:  Line placement. EXAM: PORTABLE CHEST 1 VIEW COMPARISON:  01/31/2017 at 1139 hours FINDINGS: The right PICC has been withdrawn into the lower SVC. A pacemaker remains in place. The cardiac silhouette remains prominently enlarged with unchanged mild pulmonary vascular congestion. No overt edema is seen. There may be a small left pleural effusion. No pneumothorax is identified. Metallic foreign bodies are again noted in the midline at the thoracic inlet level. IMPRESSION: Interval retraction of right PICC into the lower SVC. Electronically Signed   By: Logan Bores M.D.   On: 01/31/2017 13:51   Dg Chest Port 1 View  Result Date: 01/31/2017 CLINICAL DATA:  Central catheter placement EXAM: PORTABLE CHEST 1 VIEW COMPARISON:  January 27, 2017 FINDINGS: Central catheter tip is in the right atrium, approximately 6 cm beyond the  cavoatrial junction. No pneumothorax. Pacemaker leads are attached to the right atrium and right ventricle. No pneumothorax. There is cardiomegaly with pulmonary venous hypertension. There for small right pleural effusions with left base atelectasis. No adenopathy. There is degenerative change in each shoulder with superior migration of the humeral heads. There are metallic foreign bodies overlying the upper thoracic region. IMPRESSION: Cardiomegaly with pulmonary vascular congestion. Small pleural effusions bilaterally. No frank edema or consolidation. There is left base atelectasis. Central catheter tip in right atrium.  No pneumothorax. Chronic rotator cuff tears bilaterally. Electronically Signed   By: Lowella Grip III M.D.   On: 01/31/2017 12:00   Echo  Study Conclusions  - Left ventricle: Wall thickness was increased in a pattern of   moderate LVH. Systolic function was moderately reduced. The   estimated ejection fraction was in the range of 35% to 40%.   Akinesis of the apical myocardium. Hypokinesis of the   anteroseptal myocardium. Hypokinesis of the anterolateral   myocardium. Hypokinesis of the lateral myocardium. Hypokinesis of   the inferior myocardium. Hypokinesis of the inferior myocardium. - Mitral valve: There was moderate regurgitation. - Left atrium: The atrium was mildly dilated. - Right ventricle: The cavity size was moderately dilated. - Right atrium: The appendage was moderately dilated. - Tricuspid valve: There was severe regurgitation. - Pulmonic valve: There was moderate regurgitation. - Pericardium, extracardiac: A trivial pericardial effusion was   identified.  Assessment/Plan: Roger Winters is a 81 y.o. male being treatd with IV vanco and zosyn for Ischial osteomyelitis now with candidemia presumably related to PICC. PICC removed.  No fevers. Out of unit.   Recommendations Candidemia- s/p picc removal and neg echo  Fu bcx negative. New PICC placed-  can dc on  oral fluconazole until 12/13 (14 days from first negative blood culture.   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  Thank you very much for the consult. Will follow with you.  Leonel Ramsay   01/31/2017, 2:28 PM

## 2017-01-31 NOTE — Progress Notes (Signed)
Pt. vanc trough 45, spoke with [facility] in pharmacy, he said to stop the vancomycin, trough will be redrawn 12/4 in the A.M.

## 2017-01-31 NOTE — Discharge Instructions (Signed)
Fall and aspiration precaution. Wound care and VAC. Continue fluconazole Until Dec. 10. Hold vancomycin and check trough on Dec. 4.

## 2017-01-31 NOTE — Care Management Important Message (Signed)
Important Message  Patient Details  Name: DELWYN SCOGGIN MRN: [id number] Date of Birth: 05/20/1935   Medicare Important Message Given:  Yes Signed IM notice given    Katrina Stack, RN 01/31/2017, 8:37 AM

## 2017-01-31 NOTE — Clinical Social Work Note (Addendum)
Patient to be d/c'ed today to [facility].  Patient and family agreeable to plans will transport via [place] transportation RN to call report 101 hall nurse room [phone].  Evette Cristal, MSW, [place]

## 2017-01-31 NOTE — Progress Notes (Signed)
Peripherally Inserted Central Catheter/Midline Placement  The IV Nurse has discussed with the patient and/or persons authorized to consent for the patient, the purpose of this procedure and the potential benefits and risks involved with this procedure.  The benefits include less needle sticks, lab draws from the catheter, and the patient may be discharged home with the catheter. Risks include, but not limited to, infection, bleeding, blood clot (thrombus formation), and puncture of an artery; nerve damage and irregular heartbeat and possibility to perform a PICC exchange if needed/ordered by physician.  Alternatives to this procedure were also discussed.  B[employer] PICC patient education guide, fact sheet on infection prevention and patient information card has been provided to patient /or left at bedside.    PICC/Midline Placement Documentation  PICC Single Lumen 01/31/17 PICC Right Brachial 41 cm 0 cm (Active)  Indication for Insertion or Continuance of Line Prolonged intravenous therapies 01/31/2017 11:00 AM  Exposed Catheter (cm) 0 cm 01/31/2017 11:00 AM  Site Assessment Clean;Dry;Intact 01/31/2017 11:00 AM  Line Status Flushed;Blood return noted 01/31/2017 11:00 AM  Dressing Type Transparent 01/31/2017 11:00 AM  Dressing Status Clean;Dry;Intact 01/31/2017 11:00 AM  Dressing Intervention New dressing 01/31/2017 11:00 AM  Dressing Change Due 02/07/17 01/31/2017 11:00 AM    Telephone consent signed by Roger Winters 01/31/2017, 11:38 AM

## 2017-01-31 NOTE — Progress Notes (Signed)
Pt. Slept well throughout the night with no c/o pain, SOB or acute distress noted. Dressing to right ischium re-enforced x1.
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
## 2017-01-31 NOTE — Progress Notes (Signed)
Unable to get consent for PICC placement, Floor RN to update IV team consult when able to contact family.

## 2017-01-31 NOTE — Progress Notes (Signed)
HS snack given. Pt ate 100%.

## 2017-01-31 NOTE — Progress Notes (Signed)
[facility] Transferred to [facility]. Wound vac clamped for transport as patient's personal wound vac has no canister. Report called to [facility] and also reviewed case with a representative from [facility] program. Pt discharged form facility with R arm PICC line.

## 2017-01-31 NOTE — Progress Notes (Signed)
Pharmacy Antibiotic Note  Roger Winters is a 81 y.o. male admitted on 01/27/2017 with osteomyelitis.  Pharmacy has been consulted for vancomycin and piperacillin/tazobactam dosing.  Patient was recently hospitalized with a sacral ulcer and diagnosed with OM and was discharged with a PICC line to continue piperacillin/tazobactam and vancomycin.   Patient has been receiving vancomycin 1250 mg IV daily PTA Antibiotics to be continued through 12/19 per previous admission notes  Plan: Vancomycin random level obtained on admission = 14 mcg/mL. Unclear as to if this represents a true trough in relation to last dose, but will continue current dose since it is not supratherapeutic.   Continue vancomycin 1250 mg IV q24h Will check VT prior to 4th dose on 12/3, with SCr.  Continue piperacillin/tazobactam 3.375 g IV q8h EI  Height: 5\' 2"  (157.5 cm) Weight: 164 lb (74.4 kg) IBW/kg (Calculated) : 54.6  Temp (24hrs), Avg:97.9 F (36.6 C), Min:97.7 F (36.5 C), Max:98.1 F (36.7 C)  Recent Labs  Lab 01/27/17 1750 01/28/17 0238 01/29/17 0559 01/31/17 0518 01/31/17 0527  WBC 4.2 3.9  --   --  3.4*  CREATININE 1.20 1.19 1.09  --  1.82*  VANCOTROUGH  --  14*  --  45*  --     Estimated Creatinine Clearance: 29.1 mL/min (A) (by C-G formula based on SCr of 1.82 mg/dL (H)).    No Known Allergies  Antimicrobials this admission: Piperacillin/tazobactam 11/29 >>  vancomycin 11/30 >>   Dose adjustments this admission: 12/3 AM vanc level 45. Appears to have been drawn while infusing for ~6 minutes. SCr also bumped today to 1.82. Will ask RN to stop this infusion and recheck level in 24 hours. PRN vanc order entered as Retail banker.  Microbiology results: 11/29 BCx: No growth < 24 hours 11/19 UCx: Sent   Thank you for allowing pharmacy to be a part of this patient's care.  Eloise Harman, PharmD, BCPS Clinical Pharmacist 01/31/2017 6:02 AM

## 2017-02-01 ENCOUNTER — Emergency Department
Admission: EM | Admit: 2017-02-01 | Discharge: 2017-02-01 | Disposition: A | Discharge status: Home / Self Care | Payer: Medicare (Managed Care) | Attending: Emergency Medicine | Admitting: Emergency Medicine

## 2017-02-01 ENCOUNTER — Emergency Department: Payer: Medicare (Managed Care)

## 2017-02-01 ENCOUNTER — Encounter: Payer: Self-pay | Admitting: Emergency Medicine

## 2017-02-01 DIAGNOSIS — S065XAA Traumatic subdural hemorrhage with loss of consciousness status unknown, initial encounter: Secondary | ICD-10-CM

## 2017-02-01 DIAGNOSIS — I6203 Nontraumatic chronic subdural hemorrhage: Secondary | ICD-10-CM | POA: Diagnosis not present

## 2017-02-01 DIAGNOSIS — W06XXXA Fall from bed, initial encounter: Secondary | ICD-10-CM | POA: Diagnosis not present

## 2017-02-01 DIAGNOSIS — G8220 Paraplegia, unspecified: Secondary | ICD-10-CM | POA: Diagnosis not present

## 2017-02-01 DIAGNOSIS — N189 Chronic kidney disease, unspecified: Secondary | ICD-10-CM | POA: Insufficient documentation

## 2017-02-01 DIAGNOSIS — Z466 Encounter for fitting and adjustment of urinary device: Secondary | ICD-10-CM | POA: Insufficient documentation

## 2017-02-01 DIAGNOSIS — Z79899 Other long term (current) drug therapy: Secondary | ICD-10-CM | POA: Insufficient documentation

## 2017-02-01 DIAGNOSIS — Z87891 Personal history of nicotine dependence: Secondary | ICD-10-CM | POA: Insufficient documentation

## 2017-02-01 DIAGNOSIS — W19XXXA Unspecified fall, initial encounter: Secondary | ICD-10-CM

## 2017-02-01 DIAGNOSIS — M869 Osteomyelitis, unspecified: Secondary | ICD-10-CM | POA: Insufficient documentation

## 2017-02-01 DIAGNOSIS — E1122 Type 2 diabetes mellitus with diabetic chronic kidney disease: Secondary | ICD-10-CM | POA: Diagnosis not present

## 2017-02-01 DIAGNOSIS — Z89611 Acquired absence of right leg above knee: Secondary | ICD-10-CM | POA: Insufficient documentation

## 2017-02-01 DIAGNOSIS — Z8673 Personal history of transient ischemic attack (TIA), and cerebral infarction without residual deficits: Secondary | ICD-10-CM | POA: Diagnosis not present

## 2017-02-01 DIAGNOSIS — Z950 Presence of cardiac pacemaker: Secondary | ICD-10-CM | POA: Insufficient documentation

## 2017-02-01 DIAGNOSIS — S065X9A Traumatic subdural hemorrhage with loss of consciousness of unspecified duration, initial encounter: Secondary | ICD-10-CM

## 2017-02-01 DIAGNOSIS — E1151 Type 2 diabetes mellitus with diabetic peripheral angiopathy without gangrene: Secondary | ICD-10-CM | POA: Insufficient documentation

## 2017-02-01 DIAGNOSIS — Z515 Encounter for palliative care: Secondary | ICD-10-CM

## 2017-02-01 DIAGNOSIS — Z89612 Acquired absence of left leg above knee: Secondary | ICD-10-CM | POA: Insufficient documentation

## 2017-02-01 DIAGNOSIS — I2510 Atherosclerotic heart disease of native coronary artery without angina pectoris: Secondary | ICD-10-CM | POA: Diagnosis not present

## 2017-02-01 LAB — URINALYSIS, COMPLETE (UACMP) WITH MICROSCOPIC
Bilirubin Urine: NEGATIVE
GLUCOSE, UA: NEGATIVE mg/dL
HGB URINE DIPSTICK: NEGATIVE
Ketones, ur: NEGATIVE mg/dL
NITRITE: NEGATIVE
PH: 5 (ref 5.0–8.0)
Protein, ur: 30 mg/dL — AB
SPECIFIC GRAVITY, URINE: 1.018 (ref 1.005–1.030)

## 2017-02-01 LAB — CULTURE, BLOOD (ROUTINE X 2)
CULTURE: NO GROWTH
SPECIAL REQUESTS: ADEQUATE

## 2017-02-01 NOTE — ED Notes (Signed)
EDP to bedside to update pt and family member.

## 2017-02-01 NOTE — ED Notes (Signed)
Patient transported to CT 

## 2017-02-01 NOTE — ED Notes (Signed)
[facility] called to arrange for transportation back to facility.

## 2017-02-01 NOTE — ED Triage Notes (Signed)
Pt in via [facility] from [facility].  Pt with unwitnessed fall out of bed, found in floor on mats which surround his bed.  Pt denies hitting head, denies LOC.  Pt with double AKA, chronic foley catheter, wound vac to right sacrum upon arrival.  Vitals WDL, NAD noted at this time.
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
## 2017-02-01 NOTE — ED Notes (Signed)
Pt discharge signed per Lonn Georgia, pt's [facility] POA.

## 2017-02-02 LAB — CULTURE, BLOOD (SINGLE)
Culture: NO GROWTH
SPECIAL REQUESTS: ADEQUATE

## 2017-02-02 LAB — URINE CULTURE
Culture: 50000 — AB
SPECIAL REQUESTS: NORMAL

## 2017-07-30 DEATH — deceased
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
